# Patient Record
Sex: Female | Born: 1950 | Race: White | Hispanic: No | Marital: Married | Smoking: Never smoker
Health system: Southern US, Community
[De-identification: ages and names within clinical notes are randomized; demographics above are authoritative.]

## PROBLEM LIST (undated history)

## (undated) DIAGNOSIS — I10 Essential (primary) hypertension: Secondary | ICD-10-CM

## (undated) DIAGNOSIS — E559 Vitamin D deficiency, unspecified: Secondary | ICD-10-CM

## (undated) DIAGNOSIS — E785 Hyperlipidemia, unspecified: Secondary | ICD-10-CM

## (undated) HISTORY — PX: TONSILLECTOMY: SUR1361

## (undated) HISTORY — DX: Hyperlipidemia, unspecified: E78.5

## (undated) HISTORY — DX: Essential (primary) hypertension: I10

## (undated) HISTORY — DX: Vitamin D deficiency, unspecified: E55.9

---

## 1998-01-16 ENCOUNTER — Ambulatory Visit (HOSPITAL_COMMUNITY): Admission: RE | Admit: 1998-01-16 | Discharge: 1998-01-16 | Payer: Self-pay | Admitting: Obstetrics and Gynecology

## 1998-07-22 ENCOUNTER — Other Ambulatory Visit: Admission: RE | Admit: 1998-07-22 | Discharge: 1998-07-22 | Payer: Self-pay | Admitting: Obstetrics and Gynecology

## 2009-01-16 ENCOUNTER — Encounter: Admission: RE | Admit: 2009-01-16 | Discharge: 2009-01-16 | Payer: Self-pay | Admitting: Gastroenterology

## 2012-09-06 ENCOUNTER — Other Ambulatory Visit: Payer: Self-pay | Admitting: Family Medicine

## 2012-10-11 ENCOUNTER — Other Ambulatory Visit: Payer: Self-pay | Admitting: Family Medicine

## 2012-11-08 ENCOUNTER — Other Ambulatory Visit (INDEPENDENT_AMBULATORY_CARE_PROVIDER_SITE_OTHER): Payer: BC Managed Care – PPO

## 2012-11-08 ENCOUNTER — Other Ambulatory Visit: Payer: Self-pay | Admitting: Nurse Practitioner

## 2012-11-08 DIAGNOSIS — R5383 Other fatigue: Secondary | ICD-10-CM

## 2012-11-08 DIAGNOSIS — E785 Hyperlipidemia, unspecified: Secondary | ICD-10-CM

## 2012-11-08 DIAGNOSIS — Z79899 Other long term (current) drug therapy: Secondary | ICD-10-CM

## 2012-11-08 DIAGNOSIS — E559 Vitamin D deficiency, unspecified: Secondary | ICD-10-CM

## 2012-11-08 DIAGNOSIS — I10 Essential (primary) hypertension: Secondary | ICD-10-CM

## 2012-11-08 DIAGNOSIS — R5381 Other malaise: Secondary | ICD-10-CM

## 2012-11-08 LAB — BASIC METABOLIC PANEL WITH GFR
BUN: 12 mg/dL (ref 6–23)
CO2: 27 mEq/L (ref 19–32)
Chloride: 107 mEq/L (ref 96–112)
Glucose, Bld: 83 mg/dL (ref 70–99)
Potassium: 3.8 mEq/L (ref 3.5–5.3)

## 2012-11-08 LAB — HEPATIC FUNCTION PANEL
ALT: 15 U/L (ref 0–35)
Alkaline Phosphatase: 72 U/L (ref 39–117)
Indirect Bilirubin: 0.5 mg/dL (ref 0.0–0.9)
Total Protein: 6.2 g/dL (ref 6.0–8.3)

## 2012-11-08 LAB — POCT CBC
Granulocyte percent: 59.7 %G (ref 37–80)
HCT, POC: 32.9 % — AB (ref 37.7–47.9)
Hemoglobin: 11.4 g/dL — AB (ref 12.2–16.2)
MCV: 85 fL (ref 80–97)
RBC: 3.9 M/uL — AB (ref 4.04–5.48)
WBC: 6.2 10*3/uL (ref 4.6–10.2)

## 2012-11-09 LAB — VITAMIN D 25 HYDROXY (VIT D DEFICIENCY, FRACTURES): Vit D, 25-Hydroxy: 41 ng/mL (ref 30–89)

## 2012-11-10 ENCOUNTER — Other Ambulatory Visit: Payer: Self-pay | Admitting: Nurse Practitioner

## 2012-11-13 ENCOUNTER — Other Ambulatory Visit: Payer: Self-pay | Admitting: Nurse Practitioner

## 2012-11-13 LAB — NMR LIPOPROFILE WITH LIPIDS
Cholesterol, Total: 144 mg/dL (ref <200)
HDL Particle Number: 33 umol/L (ref >=30.5)
LDL Size: 20.6 nm (ref >20.5)
Large HDL-P: 4.7 umol/L — ABNORMAL LOW (ref >=4.8)
Small LDL Particle Number: 546 nmol/L — ABNORMAL HIGH (ref <=527)

## 2012-11-13 MED ORDER — VALACYCLOVIR HCL 1 G PO TABS: 1000.0000 mg | ORAL_TABLET | Freq: Two times a day (BID) | ORAL | Status: DC

## 2012-11-21 ENCOUNTER — Other Ambulatory Visit: Payer: Self-pay | Admitting: Family Medicine

## 2013-01-14 ENCOUNTER — Other Ambulatory Visit: Payer: Self-pay

## 2013-01-14 MED ORDER — QUINAPRIL HCL 20 MG PO TABS: 20.0000 mg | ORAL_TABLET | Freq: Every day | ORAL | Status: DC

## 2013-01-14 NOTE — Telephone Encounter (Signed)
Last seen 07/03/12  MMM  Patient requesting a 90 day supply

## 2013-03-28 ENCOUNTER — Other Ambulatory Visit (INDEPENDENT_AMBULATORY_CARE_PROVIDER_SITE_OTHER): Payer: BC Managed Care – PPO

## 2013-03-28 DIAGNOSIS — E785 Hyperlipidemia, unspecified: Secondary | ICD-10-CM

## 2013-03-28 DIAGNOSIS — R5381 Other malaise: Secondary | ICD-10-CM

## 2013-03-28 DIAGNOSIS — E559 Vitamin D deficiency, unspecified: Secondary | ICD-10-CM

## 2013-03-28 DIAGNOSIS — Z79899 Other long term (current) drug therapy: Secondary | ICD-10-CM

## 2013-03-28 LAB — POCT CBC
Hemoglobin: 13.7 g/dL (ref 12.2–16.2)
Lymph, poc: 2.2 (ref 0.6–3.4)
MCHC: 33.7 g/dL (ref 31.8–35.4)
MPV: 7.7 fL (ref 0–99.8)
POC Granulocyte: 4 (ref 2–6.9)
POC LYMPH PERCENT: 33.8 %L (ref 10–50)
Platelet Count, POC: 276 10*3/uL (ref 142–424)

## 2013-03-30 LAB — BMP8+EGFR
CO2: 28 mmol/L (ref 18–29)
Calcium: 9.8 mg/dL (ref 8.6–10.2)
Chloride: 103 mmol/L (ref 97–108)
GFR calc non Af Amer: 90 mL/min/{1.73_m2} (ref >59)
Glucose: 89 mg/dL (ref 65–99)
Potassium: 4.7 mmol/L (ref 3.5–5.2)
Sodium: 144 mmol/L (ref 134–144)

## 2013-03-30 LAB — NMR, LIPOPROFILE
Cholesterol: 149 mg/dL (ref <200)
HDL Cholesterol by NMR: 47 mg/dL (ref >=40)
LDL Particle Number: 1161 nmol/L — ABNORMAL HIGH (ref <1000)
LDLC SERPL CALC-MCNC: 83 mg/dL (ref <100)
Triglycerides by NMR: 95 mg/dL (ref <150)

## 2013-03-30 LAB — HEPATIC FUNCTION PANEL
ALT: 13 IU/L (ref 0–32)
Bilirubin, Direct: 0.13 mg/dL (ref 0.00–0.40)
Total Protein: 6.7 g/dL (ref 6.0–8.5)

## 2013-04-01 ENCOUNTER — Ambulatory Visit (INDEPENDENT_AMBULATORY_CARE_PROVIDER_SITE_OTHER): Payer: BC Managed Care – PPO | Admitting: Family Medicine

## 2013-04-01 ENCOUNTER — Encounter: Payer: Self-pay | Admitting: Family Medicine

## 2013-04-01 VITALS — BP 147/85 | HR 71 | Temp 97.6°F | Ht 64.0 in | Wt 136.0 lb

## 2013-04-01 DIAGNOSIS — I10 Essential (primary) hypertension: Secondary | ICD-10-CM

## 2013-04-01 DIAGNOSIS — E785 Hyperlipidemia, unspecified: Secondary | ICD-10-CM

## 2013-04-01 DIAGNOSIS — J309 Allergic rhinitis, unspecified: Secondary | ICD-10-CM

## 2013-04-01 DIAGNOSIS — E559 Vitamin D deficiency, unspecified: Secondary | ICD-10-CM

## 2013-04-01 NOTE — Patient Instructions (Addendum)
Continue current medications. Continue good therapeutic lifestyle changes.  Fall precautions discussed with patient. Follow up as planned and earlier as needed.  Try to get back and a better exercise routine Drink more water Eat less carbohydrates Increase vitamin D 3   To  5000 units and take 1 x  3 times weekly Nasacort is available over-the-counter use of one to 2 sprays at bedtime If any additional help is needed he may take a Claritin one daily or an Allegra as needed

## 2013-04-01 NOTE — Progress Notes (Signed)
Subjective:    Patient ID: Candice Beck, female    DOB: 21-Oct-1950, 62 y.o.   MRN: [id number]  HPI Pt here for follow up and management of chronic medical problems. Recent labs were reviewed with patient, and she understood the report.    There are no active problems to display for this patient.  Outpatient Encounter Prescriptions as of 04/01/2013  Medication Sig Dispense Refill  . atorvastatin (LIPITOR) 40 MG tablet TAKE ONE TABLET BY MOUTH ONE TIME DAILY  30 tablet  4  . cholecalciferol (VITAMIN D) 1000 UNITS tablet Take 2,000 Units by mouth daily.      . quinapril (ACCUPRIL) 20 MG tablet Take 1 tablet (20 mg total) by mouth daily.  90 tablet  0  . valACYclovir (VALTREX) 1000 MG tablet Take 1 tablet (1,000 mg total) by mouth 2 (two) times daily.  30 tablet  1   No facility-administered encounter medications on file as of 04/01/2013.    Review of Systems  Constitutional: Negative.   HENT: Negative.   Eyes: Negative.   Respiratory: Negative.   Cardiovascular: Negative.   Gastrointestinal: Negative.   Endocrine: Negative.   Genitourinary: Negative.   Musculoskeletal: Negative.   Skin: Negative.   Allergic/Immunologic: Negative.   Neurological: Negative.   Hematological: Negative.   Psychiatric/Behavioral: Negative.        Objective:   Physical Exam  Nursing note and vitals reviewed. Constitutional: She is oriented to person, place, and time. She appears well-developed and well-nourished.  HENT:  Head: Normocephalic and atraumatic.  Right Ear: External ear normal.  Left Ear: External ear normal.  Nose: Nose normal.  Mouth/Throat: Oropharynx is clear and moist.  Some nasal congestion bilaterally  Eyes: Conjunctivae and EOM are normal. Pupils are equal, round, and reactive to light. Right eye exhibits no discharge. Left eye exhibits no discharge. No scleral icterus.  Neck: Normal range of motion. Neck supple. No JVD present. No thyromegaly present.  Cardiovascular:  Normal rate, regular rhythm, normal heart sounds and intact distal pulses.  Exam reveals no gallop and no friction rub.   No murmur heard. At 72 per minute  Pulmonary/Chest: Effort normal and breath sounds normal. No respiratory distress. She has no wheezes. She has no rales. She exhibits no tenderness.  Abdominal: Soft. Bowel sounds are normal. She exhibits no mass. There is no tenderness. There is no rebound and no guarding.  Musculoskeletal: Normal range of motion. She exhibits no edema and no tenderness.  Lymphadenopathy:    She has no cervical adenopathy.  Neurological: She is alert and oriented to person, place, and time. She has normal reflexes. No cranial nerve deficit.  Skin: Skin is warm and dry.  Psychiatric: She has a normal mood and affect. Her behavior is normal. Judgment and thought content normal.   BP 147/85  Pulse 71  Temp(Src) 97.6 F (36.4 C) (Oral)  Ht 5\' 4"  (1.626 m)  Wt 136 lb (61.689 kg)  BMI 23.33 kg/m2        Assessment & Plan:   1. Hypertension   2. Hyperlipidemia   3. Vitamin D deficiency   4. Allergic rhinitis     Patient Instructions  Continue current medications. Continue good therapeutic lifestyle changes.  Fall precautions discussed with patient. Follow up as planned and earlier as needed.  Try to get back and a better exercise routine Drink more water Eat less carbohydrates Increase vitamin D 3   To  5000 units and take 1 x  3 times  weekly Nasacort is available over-the-counter use of one to 2 sprays at bedtime If any additional help is needed he may take a Claritin one daily or an Allegra as needed   Nyra Capes MD

## 2013-04-03 ENCOUNTER — Other Ambulatory Visit: Payer: Self-pay | Admitting: Nurse Practitioner

## 2013-04-03 MED ORDER — QUINAPRIL HCL 20 MG PO TABS: 20.0000 mg | ORAL_TABLET | Freq: Every day | ORAL | Status: DC

## 2013-04-25 ENCOUNTER — Other Ambulatory Visit: Payer: Self-pay | Admitting: Family Medicine

## 2013-04-25 ENCOUNTER — Other Ambulatory Visit: Payer: Self-pay

## 2013-07-30 ENCOUNTER — Other Ambulatory Visit: Payer: Self-pay | Admitting: Nurse Practitioner

## 2013-09-25 ENCOUNTER — Other Ambulatory Visit (INDEPENDENT_AMBULATORY_CARE_PROVIDER_SITE_OTHER): Payer: BC Managed Care – PPO

## 2013-09-25 DIAGNOSIS — E785 Hyperlipidemia, unspecified: Secondary | ICD-10-CM

## 2013-09-25 DIAGNOSIS — R5383 Other fatigue: Principal | ICD-10-CM

## 2013-09-25 DIAGNOSIS — E559 Vitamin D deficiency, unspecified: Secondary | ICD-10-CM

## 2013-09-25 DIAGNOSIS — R5381 Other malaise: Secondary | ICD-10-CM

## 2013-09-25 DIAGNOSIS — I10 Essential (primary) hypertension: Secondary | ICD-10-CM

## 2013-09-25 LAB — POCT CBC
Granulocyte percent: 62.2 %G (ref 37–80)
HEMATOCRIT: 39.6 % (ref 37.7–47.9)
HEMOGLOBIN: 13.1 g/dL (ref 12.2–16.2)
Lymph, poc: 2.2 (ref 0.6–3.4)
MCH: 28.7 pg (ref 27–31.2)
MCHC: 33.1 g/dL (ref 31.8–35.4)
MCV: 86.6 fL (ref 80–97)
MPV: 8.6 fL (ref 0–99.8)
POC Granulocyte: 3.8 (ref 2–6.9)
POC LYMPH PERCENT: 35.8 %L (ref 10–50)
Platelet Count, POC: 274 10*3/uL (ref 142–424)
RBC: 4.6 M/uL (ref 4.04–5.48)
RDW, POC: 13.2 %
WBC: 6.1 10*3/uL (ref 4.6–10.2)

## 2013-09-25 NOTE — Progress Notes (Signed)
Patient came in for labs only.

## 2013-09-26 ENCOUNTER — Encounter: Payer: Self-pay | Admitting: *Deleted

## 2013-09-26 NOTE — Progress Notes (Signed)
Quick Note:  Copy of labs sent to patient ______ 

## 2013-09-27 ENCOUNTER — Other Ambulatory Visit: Payer: BC Managed Care – PPO

## 2013-09-27 DIAGNOSIS — Z1212 Encounter for screening for malignant neoplasm of rectum: Secondary | ICD-10-CM

## 2013-09-27 LAB — BMP8+EGFR
BUN/Creatinine Ratio: 15 (ref 11–26)
BUN: 11 mg/dL (ref 8–27)
CO2: 26 mmol/L (ref 18–29)
Calcium: 9.6 mg/dL (ref 8.7–10.3)
Chloride: 105 mmol/L (ref 97–108)
Creatinine, Ser: 0.71 mg/dL (ref 0.57–1.00)
GFR, EST AFRICAN AMERICAN: 107 mL/min/{1.73_m2} (ref >59)
GFR, EST NON AFRICAN AMERICAN: 93 mL/min/{1.73_m2} (ref >59)
GLUCOSE: 93 mg/dL (ref 65–99)
POTASSIUM: 3.8 mmol/L (ref 3.5–5.2)
SODIUM: 144 mmol/L (ref 134–144)

## 2013-09-27 LAB — HEPATIC FUNCTION PANEL
ALK PHOS: 86 IU/L (ref 39–117)
ALT: 16 IU/L (ref 0–32)
AST: 19 IU/L (ref 0–40)
Albumin: 4.4 g/dL (ref 3.6–4.8)
BILIRUBIN DIRECT: 0.12 mg/dL (ref 0.00–0.40)
TOTAL PROTEIN: 6.6 g/dL (ref 6.0–8.5)
Total Bilirubin: 0.5 mg/dL (ref 0.0–1.2)

## 2013-09-27 LAB — NMR, LIPOPROFILE
CHOLESTEROL: 131 mg/dL (ref <200)
HDL Cholesterol by NMR: 46 mg/dL (ref >=40)
HDL Particle Number: 32.3 umol/L (ref >=30.5)
LDL PARTICLE NUMBER: 807 nmol/L (ref <1000)
LDL SIZE: 20.6 nm (ref >20.5)
LDLC SERPL CALC-MCNC: 68 mg/dL (ref <100)
LP-IR SCORE: 46 — AB (ref <=45)
SMALL LDL PARTICLE NUMBER: 361 nmol/L (ref <=527)
Triglycerides by NMR: 84 mg/dL (ref <150)

## 2013-09-27 LAB — VITAMIN D 25 HYDROXY (VIT D DEFICIENCY, FRACTURES): Vit D, 25-Hydroxy: 36.1 ng/mL (ref 30.0–100.0)

## 2013-09-27 NOTE — Progress Notes (Signed)
Pt dropped off fobt 

## 2013-09-28 LAB — FECAL OCCULT BLOOD, IMMUNOCHEMICAL: Fecal Occult Bld: NEGATIVE

## 2013-09-30 ENCOUNTER — Encounter: Payer: Self-pay | Admitting: Family Medicine

## 2013-09-30 ENCOUNTER — Ambulatory Visit (INDEPENDENT_AMBULATORY_CARE_PROVIDER_SITE_OTHER): Payer: BC Managed Care – PPO

## 2013-09-30 ENCOUNTER — Ambulatory Visit (INDEPENDENT_AMBULATORY_CARE_PROVIDER_SITE_OTHER): Payer: BC Managed Care – PPO | Admitting: Family Medicine

## 2013-09-30 VITALS — BP 130/72 | HR 65 | Temp 99.0°F | Ht 64.0 in | Wt 137.0 lb

## 2013-09-30 DIAGNOSIS — E559 Vitamin D deficiency, unspecified: Secondary | ICD-10-CM

## 2013-09-30 DIAGNOSIS — E785 Hyperlipidemia, unspecified: Secondary | ICD-10-CM

## 2013-09-30 DIAGNOSIS — I10 Essential (primary) hypertension: Secondary | ICD-10-CM

## 2013-09-30 DIAGNOSIS — Z13820 Encounter for screening for osteoporosis: Secondary | ICD-10-CM

## 2013-09-30 MED ORDER — EPINEPHRINE 0.3 MG/0.3ML IJ SOAJ: 0.3000 mg | Freq: Once | INTRAMUSCULAR | Status: DC

## 2013-09-30 NOTE — Patient Instructions (Addendum)
Continue current medications. Continue good therapeutic lifestyle changes which include good diet and exercise. Fall precautions discussed with patient. If an FOBT was given today- please return it to our front desk. If you are over 61 years old - you may need Prevnar 13 or the adult Pneumonia vaccine.  check with her insurance regarding the Prevnar vaccine Will call you with the results of the chest x-ray once those results are available  don't forget to schedule your DEXA scan in September Also a future date to remember is try to get a stress test again in May of 2018

## 2013-09-30 NOTE — Progress Notes (Signed)
Subjective:    Patient ID: Candice Beck, female    DOB: 24-Apr-1951, 63 y.o.   MRN: [id number]  HPI Pt here for follow up and management of chronic medical problems. Recent labs were the and they will be reviewed with her during the visit today. Her cholesterol numbers were excellent with taking the atorvastatin. And she has no problems with taking this. She also at was asked to increase her vitamin D to 2000 daily and she is taking 5000   3 times weekly now .         Patient Active Problem List   Diagnosis Date Noted  . Hypertension 04/01/2013  . Hyperlipidemia 04/01/2013  . Vitamin D deficiency 04/01/2013   Outpatient Encounter Prescriptions as of 09/30/2013  Medication Sig  . atorvastatin (LIPITOR) 40 MG tablet TAKE ONE TABLET BY MOUTH ONE TIME DAILY  . cholecalciferol (VITAMIN D) 1000 UNITS tablet Take 5,000 Units by mouth daily. 3 times weekly  . Coenzyme Q10 (CO Q 10 PO) Take 1 tablet by mouth daily.  . quinapril (ACCUPRIL) 20 MG tablet TAKE 1 TABLET (20 MG TOTAL) BY MOUTH DAILY.  . valACYclovir (VALTREX) 1000 MG tablet Take 1 tablet (1,000 mg total) by mouth 2 (two) times daily.    Review of Systems  Constitutional: Negative.   HENT: Negative.   Eyes: Negative.   Respiratory: Negative.   Cardiovascular: Negative.   Gastrointestinal: Negative.   Endocrine: Negative.   Genitourinary: Negative.   Musculoskeletal: Negative.   Skin: Negative.   Allergic/Immunologic: Negative.   Neurological: Negative.   Hematological: Negative.   Psychiatric/Behavioral: Negative.        Objective:   Physical Exam  Nursing note and vitals reviewed. Constitutional: She is oriented to person, place, and time. She appears well-developed and well-nourished. No distress.  HENT:  Head: Normocephalic and atraumatic.  Right Ear: External ear normal.  Left Ear: External ear normal.  Nose: Nose normal.  Mouth/Throat: Oropharynx is clear and moist. No oropharyngeal exudate.  Eyes:  Conjunctivae and EOM are normal. Pupils are equal, round, and reactive to light. Right eye exhibits no discharge. Left eye exhibits no discharge. No scleral icterus.  Neck: Normal range of motion. Neck supple. No thyromegaly present.  No carotid bruits  Cardiovascular: Normal rate, regular rhythm and normal heart sounds.  Exam reveals no gallop and no friction rub.   No murmur heard. At 72 per minute  Pulmonary/Chest: Effort normal and breath sounds normal. No respiratory distress. She has no wheezes. She has no rales.   No axillary adenopathy  Abdominal: Soft. Bowel sounds are normal. She exhibits no mass. There is no tenderness. There is no rebound and no guarding.  Musculoskeletal: Normal range of motion. She exhibits no edema and no tenderness.  Lymphadenopathy:    She has no cervical adenopathy.  Neurological: She is alert and oriented to person, place, and time.  Skin: Skin is warm and dry. No rash noted.  Psychiatric: She has a normal mood and affect. Her behavior is normal. Judgment and thought content normal.   BP 130/72  Pulse 65  Temp(Src) 99 F (37.2 C) (Oral)  Ht 5\' 4"  (1.626 m)  Wt 137 lb (62.143 kg)  BMI 23.50 kg/m2  [facility] reading (PRIMARY) by  Dr. Tracie HarrierMoore-chest x-ray- no active disease  Assessment & Plan:  1. Hyperlipidemia  2. Hypertension - DG Chest 2 View; Future  3. Vitamin D deficiency  4. Osteoporosis screening - DG Bone Density; Future  Meds ordered this encounter  Medications  . Coenzyme Q10 (CO Q 10 PO)    Sig: Take 1 tablet by mouth daily.  Marland Kitchen. EPINEPHrine (EPI-PEN) 0.3 mg/0.3 mL SOAJ injection    Sig: Inject 0.3 mLs (0.3 mg total) into the muscle once.    Dispense:  1 Device    Refill:  3   Patient Instructions  Continue current medications. Continue good therapeutic lifestyle changes which include good diet and exercise. Fall precautions discussed with patient. If an FOBT was given today- please return  it to our front desk. If you are over 508 years old - you may need Prevnar 13 or the adult Pneumonia vaccine.  check with her insurance regarding the Prevnar vaccine Will call you with the results of the chest x-ray once those results are available  don't forget to schedule your DEXA scan in September Also a future date to remember is try to get a stress test again in May of 2018     Nyra Capeson W. Kadon Andrus MD

## 2013-10-10 ENCOUNTER — Encounter: Payer: Self-pay | Admitting: *Deleted

## 2013-10-30 ENCOUNTER — Other Ambulatory Visit: Payer: Self-pay | Admitting: Family Medicine

## 2014-02-05 ENCOUNTER — Ambulatory Visit: Payer: BC Managed Care – PPO

## 2014-02-05 ENCOUNTER — Ambulatory Visit (INDEPENDENT_AMBULATORY_CARE_PROVIDER_SITE_OTHER): Payer: BC Managed Care – PPO

## 2014-02-05 DIAGNOSIS — Z13820 Encounter for screening for osteoporosis: Secondary | ICD-10-CM

## 2014-02-05 LAB — HM DEXA SCAN

## 2014-02-07 ENCOUNTER — Other Ambulatory Visit: Payer: Self-pay | Admitting: Family Medicine

## 2014-03-07 ENCOUNTER — Ambulatory Visit (INDEPENDENT_AMBULATORY_CARE_PROVIDER_SITE_OTHER): Payer: BC Managed Care – PPO | Admitting: Pharmacist

## 2014-03-07 ENCOUNTER — Encounter: Payer: Self-pay | Admitting: Pharmacist

## 2014-03-07 ENCOUNTER — Telehealth: Payer: Self-pay | Admitting: Family Medicine

## 2014-03-07 DIAGNOSIS — M949 Disorder of cartilage, unspecified: Secondary | ICD-10-CM

## 2014-03-07 DIAGNOSIS — M8580 Other specified disorders of bone density and structure, unspecified site: Secondary | ICD-10-CM

## 2014-03-07 DIAGNOSIS — M899 Disorder of bone, unspecified: Secondary | ICD-10-CM

## 2014-03-07 MED ORDER — EPINEPHRINE 0.3 MG/0.3ML IJ SOAJ: 0.3000 mg | INTRAMUSCULAR | Status: DC | PRN

## 2014-03-07 NOTE — Telephone Encounter (Signed)
Call given to TBE °

## 2014-03-07 NOTE — Patient Instructions (Signed)

## 2014-03-07 NOTE — Progress Notes (Signed)
Osteoporosis Clinic Current Height:        Max Lifetime Height:   Current Weight:         Ethnicity:Caucasian   HPI: Does pt already have a diagnosis of:  Osteopenia?  Yes Osteoporosis?  No  Back Pain?  Yes - lower right shoulder blade with activity.  No history of back surgery.      Kyphosis?  No Prior fracture?  Yes - second toe on left foot Med(s) for Osteoporosis/Osteopenia:  none Med(s) previously tried for Osteoporosis/Osteopenia:  none                                                             PMH: Age at menopause:  63 yo Hysterectomy?  No Oophorectomy?  No HRT? No Steroid Use?  No Thyroid med?  No History of cancer?  No History of digestive disorders (ie Crohn's)?  No Current or previous eating disorders?  No Last Vitamin D Result:  36.1 (09/2013) Last GFR Result:  93 (09/2013)   FH/SH: Family history of osteoporosis?  Yes - mother Parent with history of hip fracture?  No Family history of breast cancer?  Yes - mother and maternal aunt Exercise?  Yes   Smoking?  No Alcohol?  Yes - occ wine    Calcium Assessment Calcium Intake  # of servings/day  Calcium mg  Milk (8 oz) 0  x  300  = 0  Yogurt (4 oz) 1 x  200 =   Cheese (1 oz) 1 x  200 =   Other Calcium sources     Ca supplement 0 = 0   Estimated calcium intake per day     DEXA Results Date of Test T-Score for AP Spine L1-L4 T-Score for Total Left Hip T-Score for Total Right Hip T-Score for Neck of Left Hip T-Score for Neck of Right Hip  02/05/2014 -1.7 -1.1 -0.9 -1.4 -1.4  02/08/2012 -1.1 -1.0 -0.7 -1.3 -1.1  11/06/2006 -1.4 -0.7 -0.6 -1.0 -0.8          FRAX 10 year estimate: Total FX risk:  8.6%  (consider medication if >/= 20%) Hip FX risk:  0.9%  (consider medication if >/= 3%)  Assessment: Osteopenia - low estimated 10 year fracture risk but with family history of osteoporosis  Recommendations: 1.  No pharmacotherapy recommended currently 2.  recommend calcium   daily through supplementation or diet.  3.  recommend weight bearing exercise - 30 minutes at least 4 days per week.   4.  Counseled and educated about fall risk and prevention.  Recheck DEXA:  2 years  Time spent counseling patient:  30 minutes

## 2014-04-04 ENCOUNTER — Other Ambulatory Visit (INDEPENDENT_AMBULATORY_CARE_PROVIDER_SITE_OTHER): Payer: BC Managed Care – PPO

## 2014-04-04 ENCOUNTER — Other Ambulatory Visit: Payer: Self-pay | Admitting: Nurse Practitioner

## 2014-04-04 DIAGNOSIS — E559 Vitamin D deficiency, unspecified: Secondary | ICD-10-CM

## 2014-04-04 DIAGNOSIS — E785 Hyperlipidemia, unspecified: Secondary | ICD-10-CM

## 2014-04-04 DIAGNOSIS — I10 Essential (primary) hypertension: Secondary | ICD-10-CM

## 2014-04-04 LAB — POCT CBC
Granulocyte percent: 62.2 %G (ref 37–80)
HCT, POC: 39.6 % (ref 37.7–47.9)
Hemoglobin: 13.1 g/dL (ref 12.2–16.2)
LYMPH, POC: 2.4 (ref 0.6–3.4)
MCH: 28.7 pg (ref 27–31.2)
MCHC: 33.1 g/dL (ref 31.8–35.4)
MCV: 86.6 fL (ref 80–97)
MPV: 8.5 fL (ref 0–99.8)
PLATELET COUNT, POC: 270 10*3/uL (ref 142–424)
POC Granulocyte: 4.4 (ref 2–6.9)
POC LYMPH %: 33.7 % (ref 10–50)
RBC: 4.6 M/uL (ref 4.04–5.48)
RDW, POC: 13.3 %
WBC: 7 10*3/uL (ref 4.6–10.2)

## 2014-04-04 NOTE — Progress Notes (Signed)
Lab only 

## 2014-04-05 LAB — NMR, LIPOPROFILE
Cholesterol: 153 mg/dL (ref 100–199)
HDL CHOLESTEROL BY NMR: 48 mg/dL (ref >39)
HDL Particle Number: 33.5 umol/L (ref >=30.5)
LDL PARTICLE NUMBER: 1082 nmol/L — AB (ref <1000)
LDL Size: 20.6 nm (ref >20.5)
LDLC SERPL CALC-MCNC: 92 mg/dL (ref 0–99)
LP-IR Score: 46 — ABNORMAL HIGH (ref <=45)
SMALL LDL PARTICLE NUMBER: 477 nmol/L (ref <=527)
Triglycerides by NMR: 65 mg/dL (ref 0–149)

## 2014-04-05 LAB — BMP8+EGFR
BUN/Creatinine Ratio: 21 (ref 11–26)
BUN: 14 mg/dL (ref 8–27)
CALCIUM: 9.4 mg/dL (ref 8.7–10.3)
CHLORIDE: 102 mmol/L (ref 97–108)
CO2: 24 mmol/L (ref 18–29)
CREATININE: 0.68 mg/dL (ref 0.57–1.00)
GFR calc Af Amer: 109 mL/min/{1.73_m2} (ref >59)
GFR calc non Af Amer: 95 mL/min/{1.73_m2} (ref >59)
Glucose: 72 mg/dL (ref 65–99)
Potassium: 3.6 mmol/L (ref 3.5–5.2)
Sodium: 143 mmol/L (ref 134–144)

## 2014-04-05 LAB — VITAMIN D 25 HYDROXY (VIT D DEFICIENCY, FRACTURES): VIT D 25 HYDROXY: 41 ng/mL (ref 30.0–100.0)

## 2014-04-05 LAB — HEPATIC FUNCTION PANEL
ALT: 11 IU/L (ref 0–32)
AST: 16 IU/L (ref 0–40)
Albumin: 4.5 g/dL (ref 3.6–4.8)
Alkaline Phosphatase: 87 IU/L (ref 39–117)
BILIRUBIN TOTAL: 0.5 mg/dL (ref 0.0–1.2)
Bilirubin, Direct: 0.13 mg/dL (ref 0.00–0.40)
Total Protein: 6.6 g/dL (ref 6.0–8.5)

## 2014-04-07 ENCOUNTER — Ambulatory Visit: Payer: BC Managed Care – PPO | Admitting: Family Medicine

## 2014-04-07 NOTE — Telephone Encounter (Signed)
Last seen in office on 03-07-14 by pharmacist. Last OV was 09-30-13. Please advise.

## 2014-04-08 ENCOUNTER — Telehealth: Payer: Self-pay | Admitting: Family Medicine

## 2014-04-08 NOTE — Telephone Encounter (Signed)
Message copied by Azalee CourseFULP, Brayden Betters on Tue Apr 08, 2014 10:35 AM ------      Message from: Ernestina PennaMOORE, DONALD W      Created: Sat Apr 05, 2014  7:37 AM       The blood sugar is good at 72. The creatinine, the most important kidney function test is within normal limits. The electrolytes including potassium are within normal limits      All liver function tests are within normal limits      Cholesterol numbers with advanced lipid testing have increased slightly. The total LDL particle numbers now 1082. Previously it was 807. The LDL C. was 92 and previously it was 68. The triglycerides remain good at 65. The patient should continue with her atorvastatin but should make a special effort to try to do better with her diet and exercise as this has worked in the past so we know it can work again if she focuses her attention on these 2 things.      The vitamin D level is good, continue current treatment ------

## 2014-04-16 ENCOUNTER — Ambulatory Visit: Payer: BC Managed Care – PPO | Admitting: Family Medicine

## 2014-04-24 ENCOUNTER — Encounter: Payer: Self-pay | Admitting: Family Medicine

## 2014-04-24 ENCOUNTER — Ambulatory Visit (INDEPENDENT_AMBULATORY_CARE_PROVIDER_SITE_OTHER): Payer: BC Managed Care – PPO | Admitting: Family Medicine

## 2014-04-24 VITALS — BP 130/83 | HR 73 | Temp 96.9°F | Ht 64.0 in | Wt 135.0 lb

## 2014-04-24 DIAGNOSIS — E559 Vitamin D deficiency, unspecified: Secondary | ICD-10-CM

## 2014-04-24 DIAGNOSIS — I10 Essential (primary) hypertension: Secondary | ICD-10-CM

## 2014-04-24 DIAGNOSIS — E785 Hyperlipidemia, unspecified: Secondary | ICD-10-CM

## 2014-04-24 NOTE — Patient Instructions (Addendum)
Continue current medications. Continue good therapeutic lifestyle changes which include good diet and exercise. Fall precautions discussed with patient. If an FOBT was given today- please return it to our front desk. If you are over 61 years old - you may need Prevnar 13 or the adult Pneumonia vaccine.  Flu Shots will be available at our office starting mid- September. Please call and schedule a FLU CLINIC APPOINTMENT.   Resume regular cholesterol medication Increase vitamin D to 4 days weekly Resume exercise regimen

## 2014-04-24 NOTE — Progress Notes (Signed)
Subjective:    Patient ID: Candice Beck, female    DOB: 1951/06/06, 63 y.o.   MRN: [id number]  HPI Pt here for follow up and management of chronic medical problems. The patient has had recent lab work and this will be reviewed with her at the visit today the only thing that was out of line was the LDL particle number was elevated compared to previously. This may be somewhat excuse because the patient been on extended trip on the road and did not eat as well as maybe she could have. The patient also notes that she has reduced her cholesterol medicine just to see what would happen with her cholesterol        Patient Active Problem List   Diagnosis Date Noted  . Osteopenia 03/07/2014  . Hypertension 04/01/2013  . Hyperlipidemia 04/01/2013  . Vitamin D deficiency 04/01/2013   Outpatient Encounter Prescriptions as of 04/24/2014  Medication Sig  . atorvastatin (LIPITOR) 40 MG tablet TAKE ONE TABLET BY MOUTH ONE TIME DAILY  . cholecalciferol (VITAMIN D) 1000 UNITS tablet Take 5,000 Units by mouth daily. 3 times weekly  . Coenzyme Q10 (CO Q 10 PO) Take 1 tablet by mouth daily.  Marland Kitchen. EPINEPHrine 0.3 mg/0.3 mL IJ SOAJ injection Inject 0.3 mLs (0.3 mg total) into the muscle as needed (for anaphylactic / allergic reaction).  . quinapril (ACCUPRIL) 20 MG tablet TAKE ONE TABLET BY MOUTH ONE TIME DAILY  . valACYclovir (VALTREX) 1000 MG tablet TAKE ONE TABLET BY MOUTH DAILY AS NEEDED    Review of Systems  Constitutional: Negative.   HENT: Negative.   Eyes: Negative.   Respiratory: Negative.   Cardiovascular: Negative.   Gastrointestinal: Negative.   Endocrine: Negative.   Genitourinary: Negative.   Musculoskeletal: Negative.   Skin: Negative.   Allergic/Immunologic: Negative.   Neurological: Negative.   Hematological: Negative.   Psychiatric/Behavioral: Negative.        Objective:   Physical Exam  Constitutional: She is oriented to person, place, and time. She appears  well-developed and well-nourished. No distress.  Alert and cooperative  HENT:  Head: Normocephalic and atraumatic.  Right Ear: External ear normal.  Left Ear: External ear normal.  Mouth/Throat: Oropharynx is clear and moist.  Minimal nasal congestion bilaterally  Eyes: Conjunctivae and EOM are normal. Pupils are equal, round, and reactive to light. Right eye exhibits no discharge. Left eye exhibits no discharge. No scleral icterus.  Neck: Normal range of motion. Neck supple. No thyromegaly present.  Cardiovascular: Normal rate, regular rhythm, normal heart sounds and intact distal pulses.  Exam reveals no gallop and no friction rub.   No murmur heard. Pulmonary/Chest: Effort normal and breath sounds normal. No respiratory distress. She has no wheezes. She has no rales. She exhibits no tenderness.  Abdominal: Soft. Bowel sounds are normal. She exhibits no mass. There is no tenderness. There is no rebound and no guarding.  Musculoskeletal: Normal range of motion. She exhibits no edema or tenderness.  Lymphadenopathy:    She has no cervical adenopathy.  Neurological: She is alert and oriented to person, place, and time. She has normal reflexes. No cranial nerve deficit.  Skin: Skin is warm and dry. No rash noted.  Psychiatric: She has a normal mood and affect. Her behavior is normal. Judgment and thought content normal.  Nursing note and vitals reviewed.  BP 130/83 mmHg  Pulse 73  Temp(Src) 96.9 F (36.1 C) (Oral)  Ht 5\' 4"  (1.626 m)  Wt 135 lb (61.236  kg)  BMI 23.16 kg/m2        Assessment & Plan:  1. Hyperlipidemia  2. Essential hypertension  3. Vitamin D deficiency  Patient Instructions  Continue current medications. Continue good therapeutic lifestyle changes which include good diet and exercise. Fall precautions discussed with patient. If an FOBT was given today- please return it to our front desk. If you are over 61 years old - you may need Prevnar 13 or the adult  Pneumonia vaccine.  Flu Shots will be available at our office starting mid- September. Please call and schedule a FLU CLINIC APPOINTMENT.   Resume regular cholesterol medication Increase vitamin D to 4 days weekly Resume exercise regimen   Nyra Capeson W. Moore MD

## 2014-06-04 ENCOUNTER — Telehealth: Payer: Self-pay | Admitting: Family Medicine

## 2014-06-04 ENCOUNTER — Other Ambulatory Visit: Payer: Self-pay | Admitting: *Deleted

## 2014-06-04 MED ORDER — SCOPOLAMINE 1 MG/3DAYS TD PT72: 1.0000 | MEDICATED_PATCH | TRANSDERMAL | Status: DC

## 2014-06-04 NOTE — Telephone Encounter (Signed)
Going on a cruise - per dwm ok

## 2014-06-25 ENCOUNTER — Other Ambulatory Visit: Payer: Self-pay | Admitting: Family Medicine

## 2014-07-16 ENCOUNTER — Ambulatory Visit: Payer: BC Managed Care – PPO | Admitting: Nurse Practitioner

## 2014-07-31 ENCOUNTER — Ambulatory Visit (INDEPENDENT_AMBULATORY_CARE_PROVIDER_SITE_OTHER): Payer: BC Managed Care – PPO | Admitting: Nurse Practitioner

## 2014-07-31 ENCOUNTER — Encounter: Payer: Self-pay | Admitting: Nurse Practitioner

## 2014-07-31 VITALS — BP 139/81 | HR 79 | Temp 97.0°F | Ht 64.0 in | Wt 142.0 lb

## 2014-07-31 DIAGNOSIS — Z0000 Encounter for general adult medical examination without abnormal findings: Secondary | ICD-10-CM

## 2014-07-31 DIAGNOSIS — Z01419 Encounter for gynecological examination (general) (routine) without abnormal findings: Secondary | ICD-10-CM

## 2014-07-31 LAB — POCT URINALYSIS DIPSTICK
BILIRUBIN UA: NEGATIVE
GLUCOSE UA: NEGATIVE
Ketones, UA: NEGATIVE
NITRITE UA: NEGATIVE
PH UA: 5
Protein, UA: NEGATIVE
SPEC GRAV UA: 1.015
UROBILINOGEN UA: NEGATIVE

## 2014-07-31 LAB — POCT UA - MICROSCOPIC ONLY
Bacteria, U Microscopic: NEGATIVE
Casts, Ur, LPF, POC: NEGATIVE
Crystals, Ur, HPF, POC: NEGATIVE
Mucus, UA: NEGATIVE
Yeast, UA: NEGATIVE

## 2014-07-31 NOTE — Progress Notes (Signed)
   Subjective:    Patient ID: Candice Beck, female    DOB: 03/19/1951, 64 y.o.   MRN: [id number]  HPI This is a regular patient of Dr. Christell ConstantMoore that was seen for follow up in November 2015- she is here today for Pap only. She has no complaints today and says she is doing well.    Review of Systems  Constitutional: Negative.   HENT: Negative.   Respiratory: Negative.   Cardiovascular: Negative.   Gastrointestinal: Negative.   Genitourinary: Negative.   Neurological: Negative.   Psychiatric/Behavioral: Negative.   All other systems reviewed and are negative.      Objective:   Physical Exam  Constitutional: She is oriented to person, place, and time. She appears well-developed and well-nourished.  HENT:  Head: Normocephalic.  Right Ear: Hearing, tympanic membrane, external ear and ear canal normal.  Left Ear: Hearing, tympanic membrane, external ear and ear canal normal.  Nose: Nose normal.  Mouth/Throat: Uvula is midline and oropharynx is clear and moist.  Eyes: Conjunctivae and EOM are normal. Pupils are equal, round, and reactive to light.  Neck: Normal range of motion and full passive range of motion without pain. Neck supple. No JVD present. Carotid bruit is not present. No thyroid mass and no thyromegaly present.  Cardiovascular: Normal rate, normal heart sounds and intact distal pulses.   No murmur heard. Pulmonary/Chest: Effort normal and breath sounds normal. Right breast exhibits no inverted nipple, no mass, no nipple discharge, no skin change and no tenderness. Left breast exhibits no inverted nipple, no mass, no nipple discharge, no skin change and no tenderness.  Abdominal: Soft. Bowel sounds are normal. She exhibits no mass. There is no tenderness.  Genitourinary: Vagina normal and uterus normal. No breast swelling, tenderness, discharge or bleeding.  bimanual exam-No adnexal masses or tenderness. Cervical stenosis No vaginal discharge  Musculoskeletal: Normal range  of motion.  Lymphadenopathy:    She has no cervical adenopathy.  Neurological: She is alert and oriented to person, place, and time.  Skin: Skin is warm and dry.  Psychiatric: She has a normal mood and affect. Her behavior is normal. Judgment and thought content normal.   BP 139/81 mmHg  Pulse 79  Temp(Src) 97 F (36.1 C) (Oral)  Ht 5\' 4"  (1.626 m)  Wt 142 lb (64.411 kg)  BMI 24.36 kg/m2        Assessment & Plan:  1. Annual physical exam - POCT urinalysis dipstick - POCT UA - Microscopic Only  2. Encounter for routine gynecological examination - Pap IG w/ reflex to HPV when ASC-U  Health maintenance reviewed Diet and exercise encouraged PAP pending Keep follow up appointment with Dr. Cristela FeltMoore  Mary-Margaret Azarel Banner, FNP

## 2014-07-31 NOTE — Patient Instructions (Signed)
Pap Test A Pap test is a procedure done in a clinic office to evaluate cells that are on the surface of the cervix. The cervix is the lower portion of the uterus and upper portion of the vagina. For some women, the cervical region has the potential to form cancer. With consistent evaluations by your caregiver, this type of cancer can be prevented.  If a Pap test is abnormal, it is most often a result of a previous exposure to human papillomavirus (HPV). HPV is a virus that can infect the cells of the cervix and cause dysplasia. Dysplasia is where the cells no longer look normal. If a woman has been diagnosed with high-grade or severe dysplasia, they are at higher risk of developing cervical cancer. People diagnosed with low-grade dysplasia should still be seen by their caregiver because there is a small chance that low-grade dysplasia could develop into cancer.  LET YOUR CAREGIVER KNOW ABOUT:  Recent sexually transmitted infection (STI) you have had.  Any new sex partners you have had.  History of previous abnormal Pap tests results.  History of previous cervical procedures you have had (colposcopy, biopsy, loop electrosurgical excision procedure [LEEP]).  Concerns you have had regarding unusual vaginal discharge.  History of pelvic pain.  Your use of birth control. BEFORE THE PROCEDURE  Ask your caregiver when to schedule your Pap test. It is best not to be on your period if your caregiver uses a wooden spatula to collect cells or applies cells to a glass slide. Newer techniques are not so sensitive to the timing of a menstrual cycle.  Do not douche or have sexual intercourse for 24 hours before the test.   Do not use vaginal creams or tampons for 24 hours before the test.   Empty your bladder just before the test to lessen any discomfort.  PROCEDURE You will lie on an exam table with your feet in stirrups. A warm metal or plastic instrument (speculum) is placed in your vagina. This  instrument allows your caregiver to see the inside of your vagina and look at your cervix. A small, plastic brush or wooden spatula is then used to collect cervical cells. These cells are placed in a lab specimen container. The cells are looked at under a microscope. A specialist will determine if the cells are normal.  AFTER THE PROCEDURE Make sure to get your test results.If your results come back abnormal, you may need further testing.  Document Released: 08/27/2002 Document Revised: 08/29/2011 Document Reviewed: 06/02/2011 Exit[employer] Patient Information 2015 [employer]. This information is not intended to replace advice given to you by your health care provider. Make sure you discuss any questions you have with your health care provider.  

## 2014-07-31 NOTE — Addendum Note (Signed)
Addended by: Prescott GumLAND, Leeba Barbe M on: 07/31/2014 02:22 PM   Modules accepted: Kipp BroodSmartSet

## 2014-07-31 NOTE — Addendum Note (Signed)
Addended by: Prescott GumLAND, Brett Soza M on: 07/31/2014 05:09 PM   Modules accepted: Orders, SmartSet

## 2014-08-02 LAB — PAP IG W/ RFLX HPV ASCU: PAP Smear Comment: 0

## 2014-08-02 LAB — URINE CULTURE

## 2014-10-24 ENCOUNTER — Other Ambulatory Visit: Payer: Self-pay | Admitting: Family Medicine

## 2014-10-27 ENCOUNTER — Other Ambulatory Visit (INDEPENDENT_AMBULATORY_CARE_PROVIDER_SITE_OTHER): Payer: BC Managed Care – PPO

## 2014-10-27 DIAGNOSIS — E785 Hyperlipidemia, unspecified: Secondary | ICD-10-CM | POA: Diagnosis not present

## 2014-10-27 DIAGNOSIS — E559 Vitamin D deficiency, unspecified: Secondary | ICD-10-CM

## 2014-10-27 DIAGNOSIS — I10 Essential (primary) hypertension: Secondary | ICD-10-CM

## 2014-10-27 LAB — POCT CBC
GRANULOCYTE PERCENT: 66.1 % (ref 37–80)
HEMATOCRIT: 38.5 % (ref 37.7–47.9)
HEMOGLOBIN: 12.1 g/dL — AB (ref 12.2–16.2)
Lymph, poc: 2.1 (ref 0.6–3.4)
MCH, POC: 26.9 pg — AB (ref 27–31.2)
MCHC: 31.4 g/dL — AB (ref 31.8–35.4)
MCV: 85.8 fL (ref 80–97)
MPV: 8.7 fL (ref 0–99.8)
POC GRANULOCYTE: 4.9 (ref 2–6.9)
POC LYMPH %: 28.8 % (ref 10–50)
Platelet Count, POC: 322 10*3/uL (ref 142–424)
RBC: 4.49 M/uL (ref 4.04–5.48)
RDW, POC: 14.4 %
WBC: 7.4 10*3/uL (ref 4.6–10.2)

## 2014-10-27 NOTE — Progress Notes (Signed)
Lab only 

## 2014-10-28 LAB — NMR, LIPOPROFILE
Cholesterol: 131 mg/dL (ref 100–199)
HDL CHOLESTEROL BY NMR: 57 mg/dL (ref >39)
HDL Particle Number: 32.4 umol/L (ref >=30.5)
LDL Particle Number: 836 nmol/L (ref <1000)
LDL Size: 20.6 nm (ref >20.5)
LDL-C: 61 mg/dL (ref 0–99)
LP-IR SCORE: 33 (ref <=45)
SMALL LDL PARTICLE NUMBER: 369 nmol/L (ref <=527)
Triglycerides by NMR: 64 mg/dL (ref 0–149)

## 2014-10-28 LAB — BMP8+EGFR
BUN/Creatinine Ratio: 16 (ref 11–26)
BUN: 12 mg/dL (ref 8–27)
CALCIUM: 9.5 mg/dL (ref 8.7–10.3)
CO2: 25 mmol/L (ref 18–29)
Chloride: 104 mmol/L (ref 97–108)
Creatinine, Ser: 0.74 mg/dL (ref 0.57–1.00)
GFR calc Af Amer: 101 mL/min/{1.73_m2} (ref >59)
GFR calc non Af Amer: 88 mL/min/{1.73_m2} (ref >59)
Glucose: 96 mg/dL (ref 65–99)
POTASSIUM: 3.8 mmol/L (ref 3.5–5.2)
Sodium: 145 mmol/L — ABNORMAL HIGH (ref 134–144)

## 2014-10-28 LAB — HEPATIC FUNCTION PANEL
ALBUMIN: 4.4 g/dL (ref 3.6–4.8)
ALK PHOS: 99 IU/L (ref 39–117)
ALT: 15 IU/L (ref 0–32)
AST: 16 IU/L (ref 0–40)
BILIRUBIN, DIRECT: 0.14 mg/dL (ref 0.00–0.40)
Bilirubin Total: 0.4 mg/dL (ref 0.0–1.2)
Total Protein: 6.4 g/dL (ref 6.0–8.5)

## 2014-10-28 LAB — VITAMIN D 25 HYDROXY (VIT D DEFICIENCY, FRACTURES): Vit D, 25-Hydroxy: 50 ng/mL (ref 30.0–100.0)

## 2014-10-29 ENCOUNTER — Ambulatory Visit (INDEPENDENT_AMBULATORY_CARE_PROVIDER_SITE_OTHER): Payer: BC Managed Care – PPO | Admitting: Family Medicine

## 2014-10-29 ENCOUNTER — Encounter: Payer: Self-pay | Admitting: Family Medicine

## 2014-10-29 VITALS — BP 132/81 | HR 76 | Temp 97.9°F | Ht 64.0 in | Wt 140.0 lb

## 2014-10-29 DIAGNOSIS — E785 Hyperlipidemia, unspecified: Secondary | ICD-10-CM

## 2014-10-29 DIAGNOSIS — R12 Heartburn: Secondary | ICD-10-CM | POA: Diagnosis not present

## 2014-10-29 DIAGNOSIS — E559 Vitamin D deficiency, unspecified: Secondary | ICD-10-CM | POA: Diagnosis not present

## 2014-10-29 DIAGNOSIS — I10 Essential (primary) hypertension: Secondary | ICD-10-CM | POA: Diagnosis not present

## 2014-10-29 NOTE — Progress Notes (Signed)
Subjective:    Patient ID: Candice Beck, female    DOB: 22-Sep-1950, 64 y.o.   MRN: [id number]  HPI Pt here for follow up and management of chronic medical problems which includes hypertension and hyperlipidemia. She is taking medications regularly. The current lab work has already been reviewed by the patient at home and we reviewed it again with her today. She denies chest pain or shortness of breath. She does have problems occasionally with severe heartburn especially with eating certain fruits and the skin on the fruits. She had a barium swallow in the past and this showed some esophageal spasm. She will continue to think about whether she needs to go back and see the gastroenterologist for further evaluation and possible endoscopy. Otherwise her bowels are normal and there is no blood in the stool when she is up-to-date on her colonoscopies. She has no problems with voiding. All of her lab work was excellent and no changes will be made in her medication.       Patient Active Problem List   Diagnosis Date Noted  . Osteopenia 03/07/2014  . Hypertension 04/01/2013  . Hyperlipidemia 04/01/2013  . Vitamin D deficiency 04/01/2013   Outpatient Encounter Prescriptions as of 10/29/2014  Medication Sig  . atorvastatin (LIPITOR) 40 MG tablet TAKE ONE TABLET BY MOUTH ONE TIME DAILY  . cholecalciferol (VITAMIN D) 1000 UNITS tablet Take 5,000 Units by mouth daily. 3 times weekly  . Coenzyme Q10 (CO Q 10 PO) Take 1 tablet by mouth daily.  Marland Kitchen. EPINEPHrine 0.3 mg/0.3 mL IJ SOAJ injection Inject 0.3 mLs (0.3 mg total) into the muscle as needed (for anaphylactic / allergic reaction).  . quinapril (ACCUPRIL) 20 MG tablet TAKE ONE TABLET BY MOUTH ONE TIME DAILY  . valACYclovir (VALTREX) 1000 MG tablet TAKE ONE TABLET BY MOUTH DAILY AS NEEDED  . [DISCONTINUED] scopolamine (TRANSDERM-SCOP) 1 MG/3DAYS Place 1 patch (1.5 mg total) onto the skin every 3 (three) days.   No facility-administered encounter  medications on file as of 10/29/2014.     Review of Systems  Constitutional: Negative.   HENT: Negative.   Eyes: Negative.   Respiratory: Negative.   Cardiovascular: Negative.   Gastrointestinal: Negative.   Endocrine: Negative.   Genitourinary: Negative.   Musculoskeletal: Negative.   Skin: Negative.   Allergic/Immunologic: Negative.   Neurological: Negative.   Hematological: Negative.   Psychiatric/Behavioral: Negative.        Objective:   Physical Exam  Constitutional: She is oriented to person, place, and time. She appears well-developed and well-nourished.  HENT:  Head: Normocephalic and atraumatic.  Right Ear: External ear normal.  Left Ear: External ear normal.  Nose: Nose normal.  Mouth/Throat: Oropharynx is clear and moist.  Eyes: Conjunctivae and EOM are normal. Pupils are equal, round, and reactive to light. Right eye exhibits no discharge. Left eye exhibits no discharge. No scleral icterus.  Neck: Normal range of motion. Neck supple. No thyromegaly present.  Cardiovascular: Normal rate, regular rhythm and intact distal pulses.  Exam reveals no gallop and no friction rub.   No murmur heard. At 72/m  Pulmonary/Chest: Effort normal and breath sounds normal. No respiratory distress. She has no wheezes. She has no rales. She exhibits no tenderness.  Abdominal: Soft. Bowel sounds are normal. She exhibits no mass. There is no tenderness. There is no rebound and no guarding.  The abdomen was nontender without masses or organ enlargement  Musculoskeletal: Normal range of motion. She exhibits no edema.  Lymphadenopathy:  She has no cervical adenopathy.  Neurological: She is alert and oriented to person, place, and time. She has normal reflexes. No cranial nerve deficit.  Skin: Skin is warm and dry. No rash noted.  Psychiatric: She has a normal mood and affect. Her behavior is normal. Judgment and thought content normal.  Nursing note and vitals reviewed.  BP 132/81  mmHg  Pulse 76  Temp(Src) 97.9 F (36.6 C) (Oral)  Ht 5\' 4"  (1.626 m)  Wt 140 lb (63.504 kg)  BMI 24.02 kg/m2        Assessment & Plan:  1. Hyperlipidemia -The patient's lab work was reviewed with her and all of her cholesterol numbers are excellent even though she has some concern about taking atorvastatin, her brother died of a heart attack at an early age and I recommended that she should stay on the current dose of atorvastatin and we will be monitoring her blood sugars regularly.  2. Essential hypertension -Blood pressure is good today and she should continue with her current medication.  3. Vitamin D deficiency -The vitamin D level is also good at the current dose of medicines that she is taking and she should continue with this.  4. Heartburn -The patient will ponder and decide if she will also have an endoscopy regarding her heartburn swallowing issues. She will call us back and let us know and we will arrange to do this with her gastroenterologist who is Dr. Madilyn FiremanHayes.  No orders of the defined types were placed in this encounter.   Patient Instructions   Continue current medications. Continue good therapeutic lifestyle changes which include good diet and exercise. Fall precautions discussed with patient. If an FOBT was given today- please return it to our front desk. If you are over 62 years old - you may need Prevnar 13 or the adult Pneumonia vaccine.  Flu Shots are still available at our office. If you still haven't had one please call to set up a nurse visit to get one.   After your visit with us today you will receive a survey in the mail or online from [employer] regarding your care with us. Please take a moment to fill this out. Your feedback is very important to us as you can help us better understand your patient needs as well as improve your experience and satisfaction. WE CARE ABOUT YOU!!!   The patient should continue taking her atorvastatin along with  practicing aggressive therapeutic lifestyle changes which include diet and exercise She should continue her current dose of vitamin D She should check with her insurance regarding the Prevnar vaccine The visit was a good visit today and I am pleased with everything that she is doing. If she continues to have problems with her swallowing, she should give us a call back and we will arrange for her to see the gastroenterologist for a possible endoscopy  Remember to watch the skin on the fruits that you're eating and pedal the fresh fruits and see if this makes a difference with the swallowing   Nyra Capeson W. Moore MD

## 2014-10-29 NOTE — Patient Instructions (Addendum)
  Continue current medications. Continue good therapeutic lifestyle changes which include good diet and exercise. Fall precautions discussed with patient. If an FOBT was given today- please return it to our front desk. If you are over 560 years old - you may need Prevnar 13 or the adult Pneumonia vaccine.  Flu Shots are still available at our office. If you still haven't had one please call to set up a nurse visit to get one.   After your visit with us today you will receive a survey in the mail or online from [employer] regarding your care with us. Please take a moment to fill this out. Your feedback is very important to us as you can help us better understand your patient needs as well as improve your experience and satisfaction. WE CARE ABOUT YOU!!!   The patient should continue taking her atorvastatin along with practicing aggressive therapeutic lifestyle changes which include diet and exercise She should continue her current dose of vitamin D She should check with her insurance regarding the Prevnar vaccine The visit was a good visit today and I am pleased with everything that she is doing. If she continues to have problems with her swallowing, she should give us a call back and we will arrange for her to see the gastroenterologist for a possible endoscopy  Remember to watch the skin on the fruits that you're eating and pedal the fresh fruits and see if this makes a difference with the swallowing

## 2014-11-27 ENCOUNTER — Other Ambulatory Visit: Payer: Self-pay | Admitting: Family Medicine

## 2015-01-15 ENCOUNTER — Encounter: Payer: Self-pay | Admitting: *Deleted

## 2015-02-05 ENCOUNTER — Other Ambulatory Visit: Payer: Self-pay | Admitting: Family Medicine

## 2015-02-20 ENCOUNTER — Encounter: Payer: Self-pay | Admitting: Family Medicine

## 2015-04-23 ENCOUNTER — Other Ambulatory Visit (INDEPENDENT_AMBULATORY_CARE_PROVIDER_SITE_OTHER): Payer: BC Managed Care – PPO

## 2015-04-23 DIAGNOSIS — E785 Hyperlipidemia, unspecified: Secondary | ICD-10-CM

## 2015-04-23 DIAGNOSIS — I10 Essential (primary) hypertension: Secondary | ICD-10-CM

## 2015-04-23 DIAGNOSIS — E559 Vitamin D deficiency, unspecified: Secondary | ICD-10-CM

## 2015-04-23 NOTE — Progress Notes (Signed)
Lab only 

## 2015-04-24 LAB — CBC WITH DIFFERENTIAL/PLATELET
BASOS ABS: 0 10*3/uL (ref 0.0–0.2)
Basos: 0 %
EOS (ABSOLUTE): 0.2 10*3/uL (ref 0.0–0.4)
Eos: 3 %
HEMOGLOBIN: 12.5 g/dL (ref 11.1–15.9)
Hematocrit: 38.9 % (ref 34.0–46.6)
IMMATURE GRANS (ABS): 0 10*3/uL (ref 0.0–0.1)
Immature Granulocytes: 0 %
LYMPHS: 34 %
Lymphocytes Absolute: 2.3 10*3/uL (ref 0.7–3.1)
MCH: 27.8 pg (ref 26.6–33.0)
MCHC: 32.1 g/dL (ref 31.5–35.7)
MCV: 86 fL (ref 79–97)
MONOCYTES: 6 %
Monocytes Absolute: 0.4 10*3/uL (ref 0.1–0.9)
NEUTROS ABS: 4 10*3/uL (ref 1.4–7.0)
Neutrophils: 57 %
Platelets: 306 10*3/uL (ref 150–379)
RBC: 4.5 x10E6/uL (ref 3.77–5.28)
RDW: 14.8 % (ref 12.3–15.4)
WBC: 7 10*3/uL (ref 3.4–10.8)

## 2015-04-24 LAB — BMP8+EGFR
BUN/Creatinine Ratio: 20 (ref 11–26)
BUN: 14 mg/dL (ref 8–27)
CALCIUM: 9.3 mg/dL (ref 8.7–10.3)
CHLORIDE: 103 mmol/L (ref 97–106)
CO2: 28 mmol/L (ref 18–29)
Creatinine, Ser: 0.71 mg/dL (ref 0.57–1.00)
GFR, EST AFRICAN AMERICAN: 106 mL/min/{1.73_m2} (ref >59)
GFR, EST NON AFRICAN AMERICAN: 92 mL/min/{1.73_m2} (ref >59)
Glucose: 84 mg/dL (ref 65–99)
Potassium: 3.8 mmol/L (ref 3.5–5.2)
Sodium: 144 mmol/L (ref 136–144)

## 2015-04-24 LAB — NMR, LIPOPROFILE
CHOLESTEROL: 146 mg/dL (ref 100–199)
HDL Cholesterol by NMR: 50 mg/dL (ref >39)
HDL Particle Number: 34 umol/L (ref >=30.5)
LDL PARTICLE NUMBER: 953 nmol/L (ref <1000)
LDL Size: 20.8 nm (ref >20.5)
LDL-C: 74 mg/dL (ref 0–99)
LP-IR Score: 41 (ref <=45)
Small LDL Particle Number: 433 nmol/L (ref <=527)
TRIGLYCERIDES BY NMR: 110 mg/dL (ref 0–149)

## 2015-04-24 LAB — VITAMIN D 25 HYDROXY (VIT D DEFICIENCY, FRACTURES): Vit D, 25-Hydroxy: 47.5 ng/mL (ref 30.0–100.0)

## 2015-04-29 ENCOUNTER — Ambulatory Visit (INDEPENDENT_AMBULATORY_CARE_PROVIDER_SITE_OTHER): Payer: BC Managed Care – PPO | Admitting: Family Medicine

## 2015-04-29 ENCOUNTER — Encounter: Payer: Self-pay | Admitting: Family Medicine

## 2015-04-29 VITALS — BP 136/84 | HR 88 | Temp 97.8°F | Ht 64.0 in | Wt 137.0 lb

## 2015-04-29 DIAGNOSIS — J3089 Other allergic rhinitis: Secondary | ICD-10-CM

## 2015-04-29 DIAGNOSIS — Z1212 Encounter for screening for malignant neoplasm of rectum: Secondary | ICD-10-CM

## 2015-04-29 DIAGNOSIS — E785 Hyperlipidemia, unspecified: Secondary | ICD-10-CM

## 2015-04-29 DIAGNOSIS — I10 Essential (primary) hypertension: Secondary | ICD-10-CM

## 2015-04-29 DIAGNOSIS — J040 Acute laryngitis: Secondary | ICD-10-CM | POA: Diagnosis not present

## 2015-04-29 DIAGNOSIS — E559 Vitamin D deficiency, unspecified: Secondary | ICD-10-CM

## 2015-04-29 MED ORDER — VALACYCLOVIR HCL 1 G PO TABS: 1000.0000 mg | ORAL_TABLET | Freq: Every day | ORAL | Status: DC | PRN

## 2015-04-29 MED ORDER — AZITHROMYCIN 250 MG PO TABS: ORAL_TABLET | ORAL | Status: DC

## 2015-04-29 NOTE — Patient Instructions (Addendum)
Continue current medications. Continue good therapeutic lifestyle changes which include good diet and exercise. Fall precautions discussed with patient. If an FOBT was given today- please return it to our front desk. If you are over 343 years old - you may need Prevnar 13 or the adult Pneumonia vaccine.  **Flu shots are available--- please call and schedule a FLU-CLINIC appointment**  After your visit with us today you will receive a survey in the mail or online from [employer] regarding your care with us. Please take a moment to fill this out. Your feedback is very important to us as you can help us better understand your patient needs as well as improve your experience and satisfaction. WE CARE ABOUT YOU!!!   The patient should practice voice rest and should gargle with some warm salty water for the next 2 or 3 days several times daily She should drink plenty of fluids and stay well hydrated She should take Tylenol for aches pains and fever She should take the Zantac or ranitidine 150 mg, the equate brand twice daily before breakfast and supper. If her swallowing problems continue beyond 1 month she should get back in touch with us and we should arrange for her to have an endoscopy We will call her with the results of the fecal occult blood test as soon as that becomes available She should check her blood pressures occasionally at home and bring these readings with her to her office visits. She should watch her sodium intake closely. She should check with her insurance regarding the Prevnar vaccine Use nasal saline spray frequently through the day Use Mucinex maximum strength, blue and white in color, 1 twice daily with a large glass of water for cough and congestion

## 2015-04-29 NOTE — Addendum Note (Signed)
Addended by: Orma RenderHODGES, Kinzly Pierrelouis F on: 04/29/2015 11:25 AM   Modules accepted: Orders

## 2015-04-29 NOTE — Progress Notes (Signed)
Subjective:    Patient ID: Candice Beck, female    DOB: February 15, 1951, 64 y.o.   MRN: [id number]  HPI Pt here for follow up and management of chronic medical problems which includes hypertension and hypelipidemia. She is taking medications regularly. The patient complains of a voice change and some indigestion today. She has hoarseness. She is requesting a refill on her Valtrex. She returned her FOBT today and we will go over the lab work with her during the visit. She refuses to take the flu vaccine. The patient denies chest pain shortness of breath trouble passing her water or blood in the stool or black tarry bowel movements. She has had some ongoing issues with swallowing and possibly some reflux. She went on a cruise recently and this gave her a lot of problems during the cruise. She is doing somewhat better presently. She is not taking any kind of H2 blocker or PPI.      Patient Active Problem List   Diagnosis Date Noted  . Osteopenia 03/07/2014  . Hypertension 04/01/2013  . Hyperlipidemia 04/01/2013  . Vitamin D deficiency 04/01/2013   Outpatient Encounter Prescriptions as of 04/29/2015  Medication Sig  . atorvastatin (LIPITOR) 40 MG tablet TAKE ONE TABLET BY MOUTH ONE TIME DAILY  . cholecalciferol (VITAMIN D) 1000 UNITS tablet Take 5,000 Units by mouth daily. 3 times weekly  . Coenzyme Q10 (CO Q 10 PO) Take 1 tablet by mouth daily.  Marland Kitchen EPINEPHrine 0.3 mg/0.3 mL IJ SOAJ injection Inject 0.3 mLs (0.3 mg total) into the muscle as needed (for anaphylactic / allergic reaction).  . quinapril (ACCUPRIL) 20 MG tablet TAKE ONE TABLET BY MOUTH ONE TIME DAILY  . valACYclovir (VALTREX) 1000 MG tablet TAKE ONE TABLET BY MOUTH DAILY AS NEEDED   No facility-administered encounter medications on file as of 04/29/2015.      Review of Systems  Constitutional: Negative.   HENT: Positive for voice change.   Eyes: Negative.   Respiratory: Negative.   Cardiovascular: Negative.     Gastrointestinal: Negative.        Indigestion  Endocrine: Negative.   Genitourinary: Negative.   Musculoskeletal: Negative.   Skin: Negative.   Allergic/Immunologic: Negative.   Neurological: Negative.   Hematological: Negative.   Psychiatric/Behavioral: Negative.        Objective:   Physical Exam  Constitutional: She is oriented to person, place, and time. She appears well-developed and well-nourished. No distress.  HENT:  Head: Normocephalic and atraumatic.  Right Ear: External ear normal.  Left Ear: External ear normal.  Mouth/Throat: Oropharynx is clear and moist.  Nasal congestion right greater than left  Eyes: Conjunctivae and EOM are normal. Pupils are equal, round, and reactive to light. Right eye exhibits no discharge. Left eye exhibits no discharge. No scleral icterus.  Neck: Normal range of motion. Neck supple. No thyromegaly present.  Cardiovascular: Normal rate, regular rhythm, normal heart sounds and intact distal pulses.  Exam reveals no gallop and no friction rub.   No murmur heard. The heart is regular at 72/m  Pulmonary/Chest: Effort normal and breath sounds normal. No respiratory distress. She has no wheezes. She has no rales. She exhibits no tenderness.  Dry cough no rales or wheezes  Abdominal: Soft. Bowel sounds are normal. She exhibits no mass. There is tenderness. There is no rebound and no guarding.  Slight epigastric tenderness otherwise no liver or organ enlargement and no masses and no bruits  Musculoskeletal: Normal range of motion. She exhibits no  edema or tenderness.  Lymphadenopathy:    She has no cervical adenopathy.  Neurological: She is alert and oriented to person, place, and time. She has normal reflexes. No cranial nerve deficit.  Skin: Skin is warm and dry. No rash noted.  Psychiatric: She has a normal mood and affect. Her behavior is normal. Judgment and thought content normal.  Nursing note and vitals reviewed.  BP 142/87 mmHg  Pulse  88  Temp(Src) 97.8 F (36.6 C) (Oral)  Ht 5\' 4"  (1.626 m)  Wt 137 lb (62.143 kg)  BMI 23.50 kg/m2  Repeat blood pressure 136/84    Assessment & Plan:  1. Hyperlipidemia -Continue current treatment  2. Essential hypertension -The blood pressure is good today she will continue with current treatment  3. Vitamin D deficiency -The vitamin D level was good with recent lab work and she will continue with current treatment  4. Laryngitis -The patient will practice voice rest gargle with warm salty water and take Tylenol for aches pains and fever and drink plenty of fluids  5. Other allergic rhinitis -She will use nasal saline as needed for head congestion  Patient Instructions  Continue current medications. Continue good therapeutic lifestyle changes which include good diet and exercise. Fall precautions discussed with patient. If an FOBT was given today- please return it to our front desk. If you are over 62 years old - you may need Prevnar 13 or the adult Pneumonia vaccine.  **Flu shots are available--- please call and schedule a FLU-CLINIC appointment**  After your visit with us today you will receive a survey in the mail or online from [employer] regarding your care with us. Please take a moment to fill this out. Your feedback is very important to us as you can help us better understand your patient needs as well as improve your experience and satisfaction. WE CARE ABOUT YOU!!!   The patient should practice voice rest and should gargle with some warm salty water for the next 2 or 3 days several times daily She should drink plenty of fluids and stay well hydrated She should take Tylenol for aches pains and fever She should take the Zantac or ranitidine 150 mg, the equate brand twice daily before breakfast and supper. If her swallowing problems continue beyond 1 month she should get back in touch with us and we should arrange for her to have an endoscopy We will call her with the  results of the fecal occult blood test as soon as that becomes available She should check her blood pressures occasionally at home and bring these readings with her to her office visits. She should watch her sodium intake closely. She should check with her insurance regarding the Prevnar vaccine Use nasal saline spray frequently through the day Use Mucinex maximum strength, blue and white in color, 1 twice daily with a large glass of water for cough and congestion   Nyra Capeson W. Versie Fleener MD

## 2015-04-30 ENCOUNTER — Ambulatory Visit: Payer: BC Managed Care – PPO | Admitting: Family Medicine

## 2015-04-30 LAB — SPECIMEN STATUS REPORT

## 2015-04-30 LAB — HFP7+2AC
ALBUMIN: 4.4 g/dL (ref 3.6–4.8)
ALK PHOS: 92 IU/L (ref 39–117)
ALT: 14 IU/L (ref 0–32)
AST: 18 IU/L (ref 0–40)
BILIRUBIN, DIRECT: 0.09 mg/dL (ref 0.00–0.40)
Bilirubin Total: 0.3 mg/dL (ref 0.0–1.2)
GGT: 27 IU/L (ref 0–60)
LDH: 174 IU/L (ref 119–226)
TOTAL PROTEIN: 6.7 g/dL (ref 6.0–8.5)

## 2015-05-03 ENCOUNTER — Other Ambulatory Visit: Payer: Self-pay | Admitting: Family Medicine

## 2015-05-03 LAB — FECAL OCCULT BLOOD, IMMUNOCHEMICAL: Fecal Occult Bld: NEGATIVE

## 2015-06-01 ENCOUNTER — Other Ambulatory Visit: Payer: Self-pay | Admitting: Family Medicine

## 2015-06-23 ENCOUNTER — Other Ambulatory Visit: Payer: Self-pay

## 2015-06-23 MED ORDER — QUINAPRIL HCL 20 MG PO TABS: 20.0000 mg | ORAL_TABLET | Freq: Every day | ORAL | Status: DC

## 2015-06-23 MED ORDER — ATORVASTATIN CALCIUM 40 MG PO TABS: 40.0000 mg | ORAL_TABLET | Freq: Every day | ORAL | Status: DC

## 2015-06-25 ENCOUNTER — Other Ambulatory Visit: Payer: Self-pay | Admitting: *Deleted

## 2015-06-30 ENCOUNTER — Telehealth: Payer: Self-pay | Admitting: Family Medicine

## 2015-06-30 MED ORDER — ATORVASTATIN CALCIUM 40 MG PO TABS: 40.0000 mg | ORAL_TABLET | Freq: Every day | ORAL | Status: DC

## 2015-06-30 MED ORDER — QUINAPRIL HCL 20 MG PO TABS: 20.0000 mg | ORAL_TABLET | Freq: Every day | ORAL | Status: DC

## 2015-06-30 NOTE — Telephone Encounter (Signed)
Stp and advised the rx's were sent over to [facility] as requested.

## 2015-10-23 ENCOUNTER — Other Ambulatory Visit: Payer: BC Managed Care – PPO

## 2015-10-23 DIAGNOSIS — E559 Vitamin D deficiency, unspecified: Secondary | ICD-10-CM

## 2015-10-23 DIAGNOSIS — E785 Hyperlipidemia, unspecified: Secondary | ICD-10-CM

## 2015-10-23 DIAGNOSIS — I10 Essential (primary) hypertension: Secondary | ICD-10-CM

## 2015-10-24 LAB — CBC WITH DIFFERENTIAL/PLATELET
BASOS: 1 %
Basophils Absolute: 0 10*3/uL (ref 0.0–0.2)
EOS (ABSOLUTE): 0.2 10*3/uL (ref 0.0–0.4)
Eos: 4 %
HEMATOCRIT: 36.7 % (ref 34.0–46.6)
HEMOGLOBIN: 12.3 g/dL (ref 11.1–15.9)
IMMATURE GRANS (ABS): 0 10*3/uL (ref 0.0–0.1)
IMMATURE GRANULOCYTES: 0 %
LYMPHS ABS: 2 10*3/uL (ref 0.7–3.1)
Lymphs: 37 %
MCH: 29.4 pg (ref 26.6–33.0)
MCHC: 33.5 g/dL (ref 31.5–35.7)
MCV: 88 fL (ref 79–97)
MONOS ABS: 0.4 10*3/uL (ref 0.1–0.9)
Monocytes: 8 %
NEUTROS ABS: 2.8 10*3/uL (ref 1.4–7.0)
NEUTROS PCT: 50 %
Platelets: 286 10*3/uL (ref 150–379)
RBC: 4.18 x10E6/uL (ref 3.77–5.28)
RDW: 14.2 % (ref 12.3–15.4)
WBC: 5.4 10*3/uL (ref 3.4–10.8)

## 2015-10-24 LAB — HEPATIC FUNCTION PANEL
ALBUMIN: 4.2 g/dL (ref 3.6–4.8)
ALK PHOS: 83 IU/L (ref 39–117)
ALT: 13 IU/L (ref 0–32)
AST: 20 IU/L (ref 0–40)
BILIRUBIN TOTAL: 0.6 mg/dL (ref 0.0–1.2)
Bilirubin, Direct: 0.15 mg/dL (ref 0.00–0.40)
Total Protein: 6.5 g/dL (ref 6.0–8.5)

## 2015-10-24 LAB — BMP8+EGFR
BUN / CREAT RATIO: 17 (ref 12–28)
BUN: 11 mg/dL (ref 8–27)
CO2: 26 mmol/L (ref 18–29)
Calcium: 9.5 mg/dL (ref 8.7–10.3)
Chloride: 102 mmol/L (ref 96–106)
Creatinine, Ser: 0.65 mg/dL (ref 0.57–1.00)
GFR calc Af Amer: 110 mL/min/{1.73_m2} (ref >59)
GFR, EST NON AFRICAN AMERICAN: 95 mL/min/{1.73_m2} (ref >59)
Glucose: 85 mg/dL (ref 65–99)
POTASSIUM: 3.6 mmol/L (ref 3.5–5.2)
SODIUM: 143 mmol/L (ref 134–144)

## 2015-10-24 LAB — LIPID PANEL
CHOL/HDL RATIO: 2.7 ratio (ref 0.0–4.4)
Cholesterol, Total: 133 mg/dL (ref 100–199)
HDL: 49 mg/dL (ref >39)
LDL Calculated: 60 mg/dL (ref 0–99)
Triglycerides: 118 mg/dL (ref 0–149)
VLDL Cholesterol Cal: 24 mg/dL (ref 5–40)

## 2015-10-24 LAB — VITAMIN D 25 HYDROXY (VIT D DEFICIENCY, FRACTURES): Vit D, 25-Hydroxy: 41.8 ng/mL (ref 30.0–100.0)

## 2015-10-28 ENCOUNTER — Ambulatory Visit (INDEPENDENT_AMBULATORY_CARE_PROVIDER_SITE_OTHER): Payer: BC Managed Care – PPO

## 2015-10-28 ENCOUNTER — Encounter: Payer: Self-pay | Admitting: Family Medicine

## 2015-10-28 ENCOUNTER — Ambulatory Visit (INDEPENDENT_AMBULATORY_CARE_PROVIDER_SITE_OTHER): Payer: BC Managed Care – PPO | Admitting: Family Medicine

## 2015-10-28 VITALS — BP 136/77 | HR 72 | Temp 97.5°F | Ht 64.0 in | Wt 140.6 lb

## 2015-10-28 DIAGNOSIS — H6993 Unspecified Eustachian tube disorder, bilateral: Secondary | ICD-10-CM

## 2015-10-28 DIAGNOSIS — J301 Allergic rhinitis due to pollen: Secondary | ICD-10-CM | POA: Diagnosis not present

## 2015-10-28 DIAGNOSIS — I10 Essential (primary) hypertension: Secondary | ICD-10-CM | POA: Diagnosis not present

## 2015-10-28 DIAGNOSIS — E785 Hyperlipidemia, unspecified: Secondary | ICD-10-CM | POA: Diagnosis not present

## 2015-10-28 DIAGNOSIS — H6983 Other specified disorders of Eustachian tube, bilateral: Secondary | ICD-10-CM

## 2015-10-28 DIAGNOSIS — E559 Vitamin D deficiency, unspecified: Secondary | ICD-10-CM

## 2015-10-28 MED ORDER — EPINEPHRINE 0.3 MG/0.3ML IJ SOAJ: 0.3000 mg | INTRAMUSCULAR | Status: DC | PRN

## 2015-10-28 NOTE — Progress Notes (Signed)
Subjective:    Patient ID: Candice Beck, female    DOB: 06-Oct-1950, 65 y.o.   MRN: [id number]  HPI Patient is here today for a 6 month follow up on hyperlipidemia, hypertension, and vitamin d deficiency.  Patient gave blood on 4/12 and she passed out. Patient states that she started vomiting when she woke up and that for a week and a half after that she had a headache and ear pain. Patient states that her ears have still been bothering her up until yesterday. The patient has Nasacort at home but has not been using this. She denies any chest pain shortness of breath trouble swallowing heartburn indigestion nausea vomiting diarrhea blood in the stool or black tarry bowel movements. She is passing her water without problems. She does occasionally take some Zantac but rarely. All of her lab work was reviewed with her today and everything was excellent. It is important to note that her hemoglobin usually runs at the low end of the normal range around 12. She was told she gave blood to do some special finger movements and that when they check the hemoglobin prior to given the blood it was 15. Her hemoglobins have never been 15 in the office. It may be that giving the blood with the lower hemoglobin certainly could play a role with causing her to pass out after given the blood. She will keep this in mind with future phlebotomies.   Review of Systems  Constitutional: Negative.   HENT: Positive for ear pain.   Eyes: Negative.   Respiratory: Negative.   Cardiovascular: Negative.   Gastrointestinal: Negative.   Endocrine: Negative.   Genitourinary: Negative.   Musculoskeletal: Negative.   Skin: Negative.   Allergic/Immunologic: Negative.   Neurological: Negative.   Hematological: Negative.   Psychiatric/Behavioral: Negative.         Patient Active Problem List   Diagnosis Date Noted  . Osteopenia 03/07/2014  . Hypertension 04/01/2013  . Hyperlipidemia 04/01/2013  . Vitamin D deficiency  04/01/2013   Outpatient Encounter Prescriptions as of 10/28/2015  Medication Sig  . atorvastatin (LIPITOR) 40 MG tablet Take 1 tablet (40 mg total) by mouth daily.  . cholecalciferol (VITAMIN D) 1000 UNITS tablet Take 5,000 Units by mouth daily. 3 times weekly  . Coenzyme Q10 (CO Q 10 PO) Take 1 tablet by mouth daily.  Marland Kitchen EPINEPHrine 0.3 mg/0.3 mL IJ SOAJ injection Inject 0.3 mLs (0.3 mg total) into the muscle as needed (for anaphylactic / allergic reaction).  . quinapril (ACCUPRIL) 20 MG tablet Take 1 tablet (20 mg total) by mouth daily.  . valACYclovir (VALTREX) 1000 MG tablet Take 1 tablet (1,000 mg total) by mouth daily as needed.  . [DISCONTINUED] EPINEPHrine 0.3 mg/0.3 mL IJ SOAJ injection Inject 0.3 mLs (0.3 mg total) into the muscle as needed (for anaphylactic / allergic reaction).  . [DISCONTINUED] azithromycin (ZITHROMAX) 250 MG tablet As directed   No facility-administered encounter medications on file as of 10/28/2015.        Objective:   Physical Exam  Constitutional: She is oriented to person, place, and time. She appears well-developed and well-nourished. No distress.  HENT:  Head: Normocephalic and atraumatic.  Right Ear: External ear normal.  Left Ear: External ear normal.  Mouth/Throat: No oropharyngeal exudate.  Nasal congestion bilaterally  Eyes: Conjunctivae and EOM are normal. Pupils are equal, round, and reactive to light. Right eye exhibits no discharge. Left eye exhibits no discharge. No scleral icterus.  Neck: Normal range of  motion. Neck supple. No thyromegaly present.  No bruits thyromegaly or adenopathy  Cardiovascular: Normal rate, regular rhythm, normal heart sounds and intact distal pulses.   No murmur heard. The heart has a regular rate and rhythm at 72/m  Pulmonary/Chest: Effort normal and breath sounds normal. No respiratory distress. She has no wheezes. She has no rales. She exhibits no tenderness.  Clear anteriorly and posteriorly  Abdominal: Soft.  Bowel sounds are normal. She exhibits no mass. There is no tenderness. There is no rebound and no guarding.  No epigastric tenderness liver or spleen enlargement or suprapubic tenderness  Musculoskeletal: Normal range of motion. She exhibits no edema.  Lymphadenopathy:    She has no cervical adenopathy.  Neurological: She is alert and oriented to person, place, and time. She has normal reflexes. No cranial nerve deficit.  Skin: Skin is warm and dry. No rash noted.  Psychiatric: She has a normal mood and affect. Her behavior is normal. Judgment and thought content normal.  Nursing note and vitals reviewed.   BP 136/77 mmHg  Pulse 72  Temp(Src) 97.5 F (36.4 C) (Oral)  Ht 5\' 4"  (1.626 m)  Wt 140 lb 9.6 oz (63.776 kg)  BMI 24.12 kg/m2  [facility] reading (PRIMARY) by  Dr. Tracie HarrierMoore-chest x-ray with results pending                                       Assessment & Plan:  1. Essential hypertension -The blood pressure is good today and she will continue with current treatment - DG Chest 2 View; Future  2. Vitamin D deficiency -The vitamin D level was good and she will continue with current treatment  3. Hyperlipidemia -Cholesterol numbers were excellent and she will continue with current treatment  4. Eustachian tube dysfunction, bilateral -Use Nasonex more regularly for the month of June  5. Allergic rhinitis due to pollen -Use Nasonex as directed and avoid irritating environments  Meds ordered this encounter  Medications  . EPINEPHrine 0.3 mg/0.3 mL IJ SOAJ injection    Sig: Inject 0.3 mLs (0.3 mg total) into the muscle as needed (for anaphylactic / allergic reaction).    Dispense:  1 Device    Refill:  0   Patient Instructions  Stay on current medications Reconsider giving any more blood because of the syncopal episode Use Nasacort more regularly at least through the month of June Stay well hydrated Avoid irritating environments and wear respiratory protection   Nyra Capeson W.  Moore MD

## 2015-10-28 NOTE — Patient Instructions (Signed)
Stay on current medications Reconsider giving any more blood because of the syncopal episode Use Nasacort more regularly at least through the month of June Stay well hydrated Avoid irritating environments and wear respiratory protection

## 2015-11-19 ENCOUNTER — Other Ambulatory Visit: Payer: Self-pay | Admitting: Family Medicine

## 2016-02-08 ENCOUNTER — Other Ambulatory Visit: Payer: Self-pay | Admitting: Family Medicine

## 2016-05-06 ENCOUNTER — Ambulatory Visit: Payer: BC Managed Care – PPO | Admitting: Family Medicine

## 2016-05-10 ENCOUNTER — Other Ambulatory Visit: Payer: Self-pay | Admitting: Family Medicine

## 2016-05-31 ENCOUNTER — Other Ambulatory Visit: Payer: BC Managed Care – PPO

## 2016-05-31 DIAGNOSIS — E785 Hyperlipidemia, unspecified: Secondary | ICD-10-CM

## 2016-05-31 DIAGNOSIS — I10 Essential (primary) hypertension: Secondary | ICD-10-CM

## 2016-06-01 LAB — BMP8+EGFR
BUN / CREAT RATIO: 17 (ref 12–28)
BUN: 11 mg/dL (ref 8–27)
CO2: 26 mmol/L (ref 18–29)
CREATININE: 0.65 mg/dL (ref 0.57–1.00)
Calcium: 9.3 mg/dL (ref 8.7–10.3)
Chloride: 102 mmol/L (ref 96–106)
GFR, EST AFRICAN AMERICAN: 109 mL/min/{1.73_m2} (ref >59)
GFR, EST NON AFRICAN AMERICAN: 95 mL/min/{1.73_m2} (ref >59)
Glucose: 87 mg/dL (ref 65–99)
Potassium: 3.6 mmol/L (ref 3.5–5.2)
Sodium: 143 mmol/L (ref 134–144)

## 2016-06-01 LAB — CBC WITH DIFFERENTIAL/PLATELET
BASOS: 1 %
Basophils Absolute: 0 10*3/uL (ref 0.0–0.2)
EOS (ABSOLUTE): 0.2 10*3/uL (ref 0.0–0.4)
EOS: 3 %
HEMATOCRIT: 39.1 % (ref 34.0–46.6)
HEMOGLOBIN: 13.1 g/dL (ref 11.1–15.9)
IMMATURE GRANS (ABS): 0 10*3/uL (ref 0.0–0.1)
Immature Granulocytes: 1 %
LYMPHS ABS: 2 10*3/uL (ref 0.7–3.1)
Lymphs: 31 %
MCH: 29.9 pg (ref 26.6–33.0)
MCHC: 33.5 g/dL (ref 31.5–35.7)
MCV: 89 fL (ref 79–97)
MONOCYTES: 6 %
Monocytes Absolute: 0.4 10*3/uL (ref 0.1–0.9)
NEUTROS ABS: 4 10*3/uL (ref 1.4–7.0)
Neutrophils: 58 %
Platelets: 295 10*3/uL (ref 150–379)
RBC: 4.38 x10E6/uL (ref 3.77–5.28)
RDW: 13.9 % (ref 12.3–15.4)
WBC: 6.6 10*3/uL (ref 3.4–10.8)

## 2016-06-01 LAB — LIPID PANEL
CHOL/HDL RATIO: 3 ratio (ref 0.0–4.4)
CHOLESTEROL TOTAL: 131 mg/dL (ref 100–199)
HDL: 44 mg/dL (ref >39)
LDL Calculated: 71 mg/dL (ref 0–99)
TRIGLYCERIDES: 79 mg/dL (ref 0–149)
VLDL Cholesterol Cal: 16 mg/dL (ref 5–40)

## 2016-06-01 LAB — HEPATIC FUNCTION PANEL
ALK PHOS: 91 IU/L (ref 39–117)
ALT: 14 IU/L (ref 0–32)
AST: 14 IU/L (ref 0–40)
Albumin: 4.4 g/dL (ref 3.6–4.8)
BILIRUBIN, DIRECT: 0.11 mg/dL (ref 0.00–0.40)
Bilirubin Total: 0.4 mg/dL (ref 0.0–1.2)
Total Protein: 6.6 g/dL (ref 6.0–8.5)

## 2016-06-02 ENCOUNTER — Encounter: Payer: Self-pay | Admitting: Family Medicine

## 2016-06-02 ENCOUNTER — Ambulatory Visit (INDEPENDENT_AMBULATORY_CARE_PROVIDER_SITE_OTHER): Payer: BC Managed Care – PPO | Admitting: Family Medicine

## 2016-06-02 VITALS — BP 120/86 | HR 78 | Temp 97.5°F | Ht 64.0 in | Wt 136.0 lb

## 2016-06-02 DIAGNOSIS — E559 Vitamin D deficiency, unspecified: Secondary | ICD-10-CM | POA: Diagnosis not present

## 2016-06-02 DIAGNOSIS — E7800 Pure hypercholesterolemia, unspecified: Secondary | ICD-10-CM | POA: Diagnosis not present

## 2016-06-02 DIAGNOSIS — I10 Essential (primary) hypertension: Secondary | ICD-10-CM

## 2016-06-02 NOTE — Progress Notes (Signed)
Subjective:    Patient ID: Candice Beck, female    DOB: 01/10/1951, 65 y.o.   MRN: [id number]  HPI Pt here for follow up and management of chronic medical problems which includes hyperlipidemia and hypertension. She is taking medication regularly.The patient has had recent lab work done and this will be reviewed with her during the visit today. The renal function was good with a good blood sugar and creatinine. The CBC was within normal limits as well as the liver function tests. All cholesterol numbers with traditional lipid testing were good. These will be reviewed with her and she will be given a copy of the report. She has no specific complaints today and is due to return an FOBT. She refuses to take the flu shot and the Prevnar vaccine.     Patient Active Problem List   Diagnosis Date Noted  . Osteopenia 03/07/2014  . Hypertension 04/01/2013  . Hyperlipidemia 04/01/2013  . Vitamin D deficiency 04/01/2013   Outpatient Encounter Prescriptions as of 06/02/2016  Medication Sig  . atorvastatin (LIPITOR) 40 MG tablet TAKE 1 TABLET DAILY  . cholecalciferol (VITAMIN D) 1000 UNITS tablet Take 5,000 Units by mouth daily. 3 times weekly  . Coenzyme Q10 (CO Q 10 PO) Take 1 tablet by mouth daily.  . quinapril (ACCUPRIL) 20 MG tablet TAKE 1 TABLET DAILY  . EPINEPHrine 0.3 mg/0.3 mL IJ SOAJ injection Inject 0.3 mLs (0.3 mg total) into the muscle as needed (for anaphylactic / allergic reaction). (Patient not taking: Reported on 06/02/2016)  . valACYclovir (VALTREX) 1000 MG tablet Take 1 tablet (1,000 mg total) by mouth daily as needed. (Patient not taking: Reported on 06/02/2016)   No facility-administered encounter medications on file as of 06/02/2016.      Review of Systems  Constitutional: Negative.   HENT: Negative.   Eyes: Negative.   Respiratory: Negative.   Cardiovascular: Negative.   Gastrointestinal: Negative.   Endocrine: Negative.   Genitourinary: Negative.     Musculoskeletal: Negative.   Skin: Negative.   Allergic/Immunologic: Negative.   Neurological: Negative.   Hematological: Negative.   Psychiatric/Behavioral: Negative.        Objective:   Physical Exam  Constitutional: She is oriented to person, place, and time. She appears well-developed and well-nourished.  HENT:  Head: Normocephalic and atraumatic.  Right Ear: External ear normal.  Left Ear: External ear normal.  Nose: Nose normal.  Mouth/Throat: Oropharynx is clear and moist.  Eyes: Conjunctivae and EOM are normal. Pupils are equal, round, and reactive to light. Right eye exhibits no discharge. Left eye exhibits no discharge. No scleral icterus.  Neck: Normal range of motion. Neck supple. No thyromegaly present.  Cardiovascular: Normal rate, regular rhythm, normal heart sounds and intact distal pulses.   No murmur heard. Pulmonary/Chest: Effort normal and breath sounds normal. No respiratory distress. She has no wheezes. She has no rales.  Clear anteriorly and posteriorly  Abdominal: Soft. Bowel sounds are normal. She exhibits no mass. There is no tenderness. There is no rebound and no guarding.  No abdominal tenderness masses or bruits  Musculoskeletal: Normal range of motion. She exhibits no edema.  Lymphadenopathy:    She has no cervical adenopathy.  Neurological: She is alert and oriented to person, place, and time. She has normal reflexes. No cranial nerve deficit.  Skin: Skin is warm and dry. No rash noted.  Psychiatric: She has a normal mood and affect. Her behavior is normal. Judgment and thought content normal.  Nursing note  and vitals reviewed.  BP (!) 151/89 (BP Location: Left Arm)   Pulse 78   Temp 97.5 F (36.4 C) (Oral)   Ht 5\' 4"  (1.626 m)   Wt 136 lb (61.7 kg)   BMI 23.34 kg/m         Assessment & Plan:  1. Essential hypertension -The initial blood pressure was elevated. The repeat blood pressure in the office was 120/86. The patient will  continue to work with her diet and with weight loss and sodium restriction.  2. Vitamin D deficiency -Continue current treatment  3. Pure hypercholesterolemia -Continue atorvastatin and her current mode of taking this as her cholesterol numbers were very good.  Patient Instructions  Continue current medications. Continue good therapeutic lifestyle changes which include good diet and exercise. Fall precautions discussed with patient. If an FOBT was given today- please return it to our front desk. If you are over 63 years old - you may need Prevnar 13 or the adult Pneumonia vaccine.  **Flu shots are available--- please call and schedule a FLU-CLINIC appointment**  After your visit with us today you will receive a survey in the mail or online from [employer] regarding your care with us. Please take a moment to fill this out. Your feedback is very important to us as you can help us better understand your patient needs as well as improve your experience and satisfaction. WE CARE ABOUT YOU!!!   Monitor blood pressures more closely at home and bring readings to the next visit Continue with weight loss efforts and healthy eating habits  Nyra Capeson W. Elisheva Fallas MD

## 2016-06-02 NOTE — Patient Instructions (Addendum)
Continue current medications. Continue good therapeutic lifestyle changes which include good diet and exercise. Fall precautions discussed with patient. If an FOBT was given today- please return it to our front desk. If you are over 63 years old - you may need Prevnar 13 or the adult Pneumonia vaccine.  **Flu shots are available--- please call and schedule a FLU-CLINIC appointment**  After your visit with us today you will receive a survey in the mail or online from [employer] regarding your care with us. Please take a moment to fill this out. Your feedback is very important to us as you can help us better understand your patient needs as well as improve your experience and satisfaction. WE CARE ABOUT YOU!!!   Monitor blood pressures more closely at home and bring readings to the next visit Continue with weight loss efforts and healthy eating habits

## 2016-11-11 ENCOUNTER — Other Ambulatory Visit: Payer: Self-pay | Admitting: Family Medicine

## 2016-11-23 ENCOUNTER — Other Ambulatory Visit: Payer: BC Managed Care – PPO

## 2016-11-23 DIAGNOSIS — I10 Essential (primary) hypertension: Secondary | ICD-10-CM

## 2016-11-23 DIAGNOSIS — E7800 Pure hypercholesterolemia, unspecified: Secondary | ICD-10-CM

## 2016-11-23 DIAGNOSIS — E559 Vitamin D deficiency, unspecified: Secondary | ICD-10-CM

## 2016-11-23 DIAGNOSIS — M8580 Other specified disorders of bone density and structure, unspecified site: Secondary | ICD-10-CM

## 2016-11-24 LAB — BMP8+EGFR
BUN/Creatinine Ratio: 21 (ref 12–28)
BUN: 15 mg/dL (ref 8–27)
CALCIUM: 9.4 mg/dL (ref 8.7–10.3)
CHLORIDE: 105 mmol/L (ref 96–106)
CO2: 24 mmol/L (ref 18–29)
Creatinine, Ser: 0.71 mg/dL (ref 0.57–1.00)
GFR calc Af Amer: 104 mL/min/{1.73_m2} (ref >59)
GFR calc non Af Amer: 90 mL/min/{1.73_m2} (ref >59)
GLUCOSE: 87 mg/dL (ref 65–99)
Potassium: 3.9 mmol/L (ref 3.5–5.2)
Sodium: 143 mmol/L (ref 134–144)

## 2016-11-24 LAB — LIPID PANEL
CHOL/HDL RATIO: 3.3 ratio (ref 0.0–4.4)
CHOLESTEROL TOTAL: 151 mg/dL (ref 100–199)
HDL: 46 mg/dL (ref >39)
LDL Calculated: 87 mg/dL (ref 0–99)
TRIGLYCERIDES: 91 mg/dL (ref 0–149)
VLDL Cholesterol Cal: 18 mg/dL (ref 5–40)

## 2016-11-24 LAB — CBC WITH DIFFERENTIAL/PLATELET
BASOS: 1 %
Basophils Absolute: 0 10*3/uL (ref 0.0–0.2)
EOS (ABSOLUTE): 0.2 10*3/uL (ref 0.0–0.4)
EOS: 2 %
Hematocrit: 37.9 % (ref 34.0–46.6)
Hemoglobin: 12.8 g/dL (ref 11.1–15.9)
IMMATURE GRANS (ABS): 0 10*3/uL (ref 0.0–0.1)
IMMATURE GRANULOCYTES: 0 %
LYMPHS: 32 %
Lymphocytes Absolute: 2.1 10*3/uL (ref 0.7–3.1)
MCH: 29.7 pg (ref 26.6–33.0)
MCHC: 33.8 g/dL (ref 31.5–35.7)
MCV: 88 fL (ref 79–97)
Monocytes Absolute: 0.3 10*3/uL (ref 0.1–0.9)
Monocytes: 5 %
NEUTROS PCT: 60 %
Neutrophils Absolute: 3.8 10*3/uL (ref 1.4–7.0)
PLATELETS: 287 10*3/uL (ref 150–379)
RBC: 4.31 x10E6/uL (ref 3.77–5.28)
RDW: 14.4 % (ref 12.3–15.4)
WBC: 6.4 10*3/uL (ref 3.4–10.8)

## 2016-11-24 LAB — VITAMIN D 25 HYDROXY (VIT D DEFICIENCY, FRACTURES): Vit D, 25-Hydroxy: 33.4 ng/mL (ref 30.0–100.0)

## 2016-11-30 ENCOUNTER — Ambulatory Visit (INDEPENDENT_AMBULATORY_CARE_PROVIDER_SITE_OTHER): Payer: BC Managed Care – PPO | Admitting: Family Medicine

## 2016-11-30 ENCOUNTER — Ambulatory Visit (INDEPENDENT_AMBULATORY_CARE_PROVIDER_SITE_OTHER): Payer: BC Managed Care – PPO

## 2016-11-30 ENCOUNTER — Encounter: Payer: Self-pay | Admitting: Family Medicine

## 2016-11-30 VITALS — BP 158/86 | HR 81 | Temp 98.5°F | Ht 64.0 in | Wt 143.0 lb

## 2016-11-30 DIAGNOSIS — I10 Essential (primary) hypertension: Secondary | ICD-10-CM | POA: Diagnosis not present

## 2016-11-30 DIAGNOSIS — E559 Vitamin D deficiency, unspecified: Secondary | ICD-10-CM | POA: Diagnosis not present

## 2016-11-30 DIAGNOSIS — R413 Other amnesia: Secondary | ICD-10-CM

## 2016-11-30 DIAGNOSIS — M810 Age-related osteoporosis without current pathological fracture: Secondary | ICD-10-CM

## 2016-11-30 DIAGNOSIS — Z13820 Encounter for screening for osteoporosis: Secondary | ICD-10-CM

## 2016-11-30 DIAGNOSIS — R42 Dizziness and giddiness: Secondary | ICD-10-CM

## 2016-11-30 DIAGNOSIS — E7800 Pure hypercholesterolemia, unspecified: Secondary | ICD-10-CM | POA: Diagnosis not present

## 2016-11-30 MED ORDER — VALACYCLOVIR HCL 1 G PO TABS: 1000.0000 mg | ORAL_TABLET | Freq: Every day | ORAL | 3 refills | Status: DC | PRN

## 2016-11-30 MED ORDER — EPINEPHRINE 0.3 MG/0.3ML IJ SOAJ: 0.3000 mg | INTRAMUSCULAR | 1 refills | Status: DC | PRN

## 2016-11-30 MED ORDER — HYDROCHLOROTHIAZIDE 12.5 MG PO TABS: 12.5000 mg | ORAL_TABLET | Freq: Every day | ORAL | 3 refills | Status: DC

## 2016-11-30 NOTE — Patient Instructions (Addendum)
Continue current medications. Continue good therapeutic lifestyle changes which include good diet and exercise. Fall precautions discussed with patient. If an FOBT was given today- please return it to our front desk. If you are over 64 years old - you may need Prevnar 13 or the adult P59neumonia vaccine.  **Flu shots are available--- please call and schedule a FLU-CLINIC appointment**  After your visit with us today you will receive a survey in the mail or online from [employer] regarding your care with us. Please take a moment to fill this out. Your feedback is very important to us as you can help us better understand your patient needs as well as improve your experience and satisfaction. WE CARE ABOUT YOU!!!   Take additional blood pressure medication 1 daily every morning. This is a fluid pill. It is a low-dose fluid pill. The patient will need to come by and have her blood pressure rechecked by the neuro checks in the next 10-14 days. She will need a BMP at that time. We will arrange for her to see a neurologist about this episode that occurred around the time of her mother's death. If she does not hear from us within a certain period of time the next couple weeks she will call us back about this. In the meantime she should drink plenty of water and stay well hydrated and watch her sodium intake closely Use Flonase more regularly 1-2 sprays each nostril at bedtime Can take Benadryl as needed Avoid NSAIDs

## 2016-11-30 NOTE — Progress Notes (Signed)
Subjective:    Patient ID: Candice Beck, female    DOB: Feb 22, 1951, 66 y.o.   MRN: [id number]  HPI Pt here for follow up and management of chronic medical problems which includes hyperlipidemia and hypertension. She is taking medication regularly.The patient recently lost her mother. She is concerned about her memory. She also is concerned about some in her ear problems and dizziness. She is requesting a refill on Valtrex and EpiPen. She is going be scheduled for pelvic exam and Pap smear with Gennette PacMary Margaret. She will get a DEXA scan today and will be given an FOBT to return. She is due a colonoscopy soon. She has had lab work done and we will review this with the patient during the visit today. All of her cholesterol numbers were good with an LDL C being 87. Blood sugar renal function and electrolytes were normal. Vitamin D level was at the low end of the normal range at 33.4. We will to make sure that she is taking her vitamin D regularly. The CBC was within normal limits. She will be given a copy of the lab work. The patient just lost her mother at 66 years of age. She died from complications of a blood clot diverticulitis and sepsis. The patient was very anxious and upset. She had an episode around May 11 where she went for about 12 hours and does not remember anything that happened during that period time and this was right before her mother died and went to hospice. Since that day she is still remains somewhat hazy are foggy in her mind. While talking her she recall many of the details without problems of her mother's illness and demise. I do not pick up anything with memory issues currently. She is obviously concerned about this and doesn't understand what has happened. She has had some in her ear type sensations with dizziness. She denies any chest pain shortness of breath GI symptoms like nausea vomiting diarrhea or blood in the stool and has no trouble passing her water. She is better with the  dizziness. A repeat of the blood pressure was 158/86 in the right arm sitting.    Patient Active Problem List   Diagnosis Date Noted  . Osteopenia 03/07/2014  . Hypertension 04/01/2013  . Hyperlipidemia 04/01/2013  . Vitamin D deficiency 04/01/2013   Outpatient Encounter Prescriptions as of 11/30/2016  Medication Sig  . atorvastatin (LIPITOR) 40 MG tablet TAKE 1 TABLET DAILY  . cholecalciferol (VITAMIN D) 1000 UNITS tablet Take 5,000 Units by mouth daily. 3 times weekly  . Coenzyme Q10 (CO Q 10 PO) Take 1 tablet by mouth daily.  . quinapril (ACCUPRIL) 20 MG tablet TAKE 1 TABLET DAILY  . valACYclovir (VALTREX) 1000 MG tablet Take 1 tablet (1,000 mg total) by mouth daily as needed.  Marland Kitchen. EPINEPHrine 0.3 mg/0.3 mL IJ SOAJ injection Inject 0.3 mLs (0.3 mg total) into the muscle as needed (for anaphylactic / allergic reaction). (Patient not taking: Reported on 11/30/2016)   No facility-administered encounter medications on file as of 11/30/2016.      Review of Systems  Constitutional: Negative.   HENT: Negative.        Inner ear   Eyes: Negative.   Respiratory: Negative.   Cardiovascular: Negative.   Gastrointestinal: Negative.   Endocrine: Negative.   Genitourinary: Negative.   Musculoskeletal: Negative.   Skin: Negative.   Allergic/Immunologic: Negative.   Neurological: Positive for dizziness.       Episode on 10/28/16 -  memory loss - she lost her mom that day  Hematological: Negative.   Psychiatric/Behavioral: Negative.        Objective:   Physical Exam  Constitutional: She is oriented to person, place, and time. She appears well-developed and well-nourished. No distress.  The patient is pleasant and alert and sad because the loss of her mother.  HENT:  Head: Normocephalic and atraumatic.  Right Ear: External ear normal.  Left Ear: External ear normal.  Mouth/Throat: Oropharynx is clear and moist. No oropharyngeal exudate.  Nasal congestion turbinate swelling bilaterally    Eyes: Conjunctivae and EOM are normal. Pupils are equal, round, and reactive to light. Right eye exhibits no discharge. Left eye exhibits no discharge. No scleral icterus.  Neck: Normal range of motion. Neck supple. No thyromegaly present.  No bruits thyromegaly or anterior cervical adenopathy  Cardiovascular: Normal rate, regular rhythm, normal heart sounds and intact distal pulses.   No murmur heard. A recheck of the blood pressure by me was 154/89 with the patient sitting in the right arm with a regular cuff Heart had a regular rate and rhythm at 72/m  Pulmonary/Chest: Effort normal and breath sounds normal. No respiratory distress. She has no wheezes. She has no rales.  Clear anteriorly and posteriorly  Abdominal: Soft. Bowel sounds are normal. She exhibits no mass. There is no tenderness. There is no rebound and no guarding.  No abdominal tenderness masses or bruits  Musculoskeletal: Normal range of motion. She exhibits no edema.  Lymphadenopathy:    She has no cervical adenopathy.  Neurological: She is alert and oriented to person, place, and time. She has normal reflexes. No cranial nerve deficit.  Skin: Skin is warm and dry. No rash noted.  Psychiatric: She has a normal mood and affect. Her behavior is normal. Judgment and thought content normal.  Nursing note and vitals reviewed.  BP (!) 154/89   Pulse 81   Temp 98.5 F (36.9 C) (Oral)   Ht 5\' 4"  (1.626 m)   Wt 143 lb (64.9 kg)   BMI 24.55 kg/m         Assessment & Plan:  1. Essential hypertension -The blood pressure is elevated today. The patient has been through a lot of stress. She is only taking Accupril 20 mg once daily. We will add HCTZ 12.5 one daily to the current Accupril medication.  2. Vitamin D deficiency -The patient is not been taking her recommended vitamin D dose and we'll increase this dose back to her usual dose. - DG [facility] DEXA; Future  3. Pure hypercholesterolemia -Cholesterol numbers with  traditional lipid testing were good and she will continue with aggressive therapeutic lifestyle changes and current treatment  4. Screening for osteoporosis - DG [facility] DEXA; Future  5. Osteoporosis, unspecified osteoporosis type, unspecified pathological fracture presence - DG [facility] DEXA; Future  6. Amnesia - Ambulatory referral to Neurology  7. Dizziness -This is better. The patient has had this present before. She thinks it is related to her allergies. - Ambulatory referral to Neurology  Meds ordered this encounter  Medications  . EPINEPHrine 0.3 mg/0.3 mL IJ SOAJ injection    Sig: Inject 0.3 mLs (0.3 mg total) into the muscle as needed (for anaphylactic / allergic reaction).    Dispense:  1 Device    Refill:  1  . valACYclovir (VALTREX) 1000 MG tablet    Sig: Take 1 tablet (1,000 mg total) by mouth daily as needed.    Dispense:  30 tablet  Refill:  3  . hydrochlorothiazide (HYDRODIURIL) 12.5 MG tablet    Sig: Take 1 tablet (12.5 mg total) by mouth daily.    Dispense:  90 tablet    Refill:  3   Patient Instructions  Continue current medications. Continue good therapeutic lifestyle changes which include good diet and exercise. Fall precautions discussed with patient. If an FOBT was given today- please return it to our front desk. If you are over 30 years old - you may need Prevnar 13 or the adult Pneumonia vaccine.  **Flu shots are available--- please call and schedule a FLU-CLINIC appointment**  After your visit with [place] today you will receive a survey in the mail or online from [employer] regarding your care with [place]. Please take a moment to fill this out. Your feedback is very important to [place] as you can help [place] better understand your patient needs as well as improve your experience and satisfaction. WE CARE ABOUT YOU!!!   Take additional blood pressure medication 1 daily every morning. This is a fluid pill. It is a low-dose fluid pill. The patient will need to come by and  have her blood pressure rechecked by the neuro checks in the next 10-14 days. She will need a BMP at that time. We will arrange for her to see a neurologist about this episode that occurred around the time of her mother's death. If she does not hear from [place] within a certain period of time the next couple weeks she will call us back about this. In the meantime she should drink plenty of water and stay well hydrated and watch her sodium intake closely Use Flonase more regularly 1-2 sprays each nostril at bedtime Can take Benadryl as needed Avoid NSAIDs  Nyra Capes MD

## 2016-12-15 ENCOUNTER — Ambulatory Visit (INDEPENDENT_AMBULATORY_CARE_PROVIDER_SITE_OTHER): Payer: BC Managed Care – PPO | Admitting: *Deleted

## 2016-12-15 DIAGNOSIS — I10 Essential (primary) hypertension: Secondary | ICD-10-CM

## 2016-12-15 LAB — BMP8+EGFR
BUN/Creatinine Ratio: 13 (ref 12–28)
BUN: 10 mg/dL (ref 8–27)
CO2: 25 mmol/L (ref 20–29)
CREATININE: 0.8 mg/dL (ref 0.57–1.00)
Calcium: 9.7 mg/dL (ref 8.7–10.3)
Chloride: 103 mmol/L (ref 96–106)
GFR calc Af Amer: 90 mL/min/{1.73_m2} (ref >59)
GFR calc non Af Amer: 78 mL/min/{1.73_m2} (ref >59)
GLUCOSE: 94 mg/dL (ref 65–99)
Potassium: 3.6 mmol/L (ref 3.5–5.2)
SODIUM: 145 mmol/L — AB (ref 134–144)

## 2016-12-15 NOTE — Progress Notes (Signed)
Pt here for BP check BP 126/72 P 81  On pt's machine    BP   129/72 P 77

## 2017-01-31 ENCOUNTER — Ambulatory Visit (INDEPENDENT_AMBULATORY_CARE_PROVIDER_SITE_OTHER): Payer: BC Managed Care – PPO | Admitting: Diagnostic Neuroimaging

## 2017-01-31 ENCOUNTER — Encounter (INDEPENDENT_AMBULATORY_CARE_PROVIDER_SITE_OTHER): Payer: Self-pay

## 2017-01-31 ENCOUNTER — Encounter: Payer: Self-pay | Admitting: Diagnostic Neuroimaging

## 2017-01-31 VITALS — Resp 16 | Ht 64.0 in | Wt 141.8 lb

## 2017-01-31 DIAGNOSIS — G454 Transient global amnesia: Secondary | ICD-10-CM | POA: Diagnosis not present

## 2017-01-31 DIAGNOSIS — R42 Dizziness and giddiness: Secondary | ICD-10-CM | POA: Diagnosis not present

## 2017-01-31 DIAGNOSIS — H81391 Other peripheral vertigo, right ear: Secondary | ICD-10-CM | POA: Diagnosis not present

## 2017-01-31 NOTE — Patient Instructions (Signed)
Thank you for coming to see [place] at [facility]. I hope we have been able to provide you high quality care today.  You may receive a patient satisfaction survey over the next few weeks. We would appreciate your feedback and comments so that we may continue to improve ourselves and the health of our patients.   - MRI brain , MRA head  - TTE, carotid u/s  - start aspirin 102m daily    ~~~~~~~~~~~~~~~~~~~~~~~~~~~~~~~~~~~~~~~~~~~~~~~~~~~~~~~~~~~~~~~~~  DR. Grae Leathers'S GUIDE TO HAPPY AND HEALTHY LIVING These are some of my general health and wellness recommendations. Some of them may apply to you better than others. Please use common sense as you try these suggestions and feel free to ask me any questions.   ACTIVITY/FITNESS Mental, social, emotional and physical stimulation are very important for brain and body health. Try learning a new activity (arts, music, language, sports, games).  Keep moving your body to the best of your abilities. You can do this at home, inside or outside, the park, community center, gym or anywhere you like. Consider a physical therapist or personal trainer to get started. Consider the app Sworkit. Fitness trackers such as smart-watches, smart-phones or Fitbits can help as well.   NUTRITION Eat more plants: colorful vegetables, nuts, seeds and berries.  Eat less sugar, salt, preservatives and processed foods.  Avoid toxins such as cigarettes and alcohol.  Drink water when you are thirsty. Warm water with a slice of lemon is an excellent morning drink to start the day.  Consider these websites for more information The [employer] (hhttps://www.henry-hernandez.biz/ [employer] (wWindowBlog.ch   RELAXATION Consider practicing mindfulness meditation or other relaxation techniques such as deep breathing, prayer, yoga, tai chi, massage. See website mindful.org or the apps Headspace or  Calm to help get started.   SLEEP Try to get at least 7-8+ hours sleep per day. Regular exercise and reduced caffeine will help you sleep better. Practice good sleep hygeine techniques. See website sleep.org for more information.   PLANNING Prepare estate planning, living will, healthcare POA documents. Sometimes this is best planned with the help of an attorney. Theconversationproject.org and agingwithdignity.org are excellent resources.

## 2017-01-31 NOTE — Progress Notes (Signed)
[facility]  PATIENT: Candice Beck DOB: 1951/05/16  REFERRING CLINICIAN: Gari Crown, MD HISTORY FROM: patient and husband  REASON FOR VISIT: new consult    HISTORICAL  CHIEF COMPLAINT:  Chief Complaint  Patient presents with  . Memory Loss    Pt. reports difficulty with memory, mental clarity, some dizziness, since her mother passed away 2016/11/15.  Feels sx. are related to grieving process./fim  . Dizziness    HISTORY OF PRESENT ILLNESS:   66 year old female with history of migraine, here for evaluation of transient memory lapse.  11-15-16 patient's mother who had been in hospice for several weeks, passed away. Patient was called by hospice and informed of this news. Following this patient had significant memory lapse during that day. She was repeating herself throughout the day. She was having difficulty remembering what happened during that day. Symptoms continued throughout the day and improved by the next day. However she continued to have foggy hazy sensation in her mind for several days and weeks afterwards. Within a few weeks symptoms had fully resolved.  No associated numbness, weakness, slurred speech, vision changes or gait difficulty.  Symptoms have fully resolved. Patient feels a symptomatic today.  Patient was looking online and thinks she may have had "transient global amnesia".  No prior similar events.  In addition patient has had some intermittent dizzy spells which she feels is related to her sinuses. Sometimes when she turns her head to the right side she feels a spinning sensation. No associated nausea or vomiting or ringing in the ears.    REVIEW OF SYSTEMS: Full 14 system review of systems performed and negative with exception of: Only as per history of present illness.    ALLERGIES: No Known Allergies  HOME MEDICATIONS: Outpatient Medications Prior to Visit  Medication Sig Dispense Refill  . atorvastatin (LIPITOR) 40 MG tablet  TAKE 1 TABLET DAILY 90 tablet 0  . cholecalciferol (VITAMIN D) 1000 UNITS tablet Take 5,000 Units by mouth daily. 3 times weekly    . Coenzyme Q10 (CO Q 10 PO) Take 1 tablet by mouth daily.    Marland Kitchen EPINEPHrine 0.3 mg/0.3 mL IJ SOAJ injection Inject 0.3 mLs (0.3 mg total) into the muscle as needed (for anaphylactic / allergic reaction). 1 Device 1  . hydrochlorothiazide (HYDRODIURIL) 12.5 MG tablet Take 1 tablet (12.5 mg total) by mouth daily. 90 tablet 3  . quinapril (ACCUPRIL) 20 MG tablet TAKE 1 TABLET DAILY 90 tablet 1  . valACYclovir (VALTREX) 1000 MG tablet Take 1 tablet (1,000 mg total) by mouth daily as needed. 30 tablet 3   No facility-administered medications prior to visit.     PAST MEDICAL HISTORY: Past Medical History:  Diagnosis Date  . Hyperlipidemia   . Hypertension   . Vitamin D deficiency     PAST SURGICAL HISTORY: Past Surgical History:  Procedure Laterality Date  . TONSILLECTOMY      FAMILY HISTORY: Family History  Problem Relation Age of Onset  . Cancer Mother        breast   . Heart disease Mother        aortic valve replaced x2  . Osteoporosis Mother   . Cancer Father        throat  . Heart disease Brother        heart attack age 94  . Diabetes Brother        due to weight  . Hypertension Brother   . Hypertension Brother   . Cancer  Maternal Aunt        breast      SOCIAL HISTORY:  Social History   Social History  . Marital status: Married    Spouse name: N/A  . Number of children: 2  . Years of education: 14   Occupational History  . Not on file.   Social History Main Topics  . Smoking status: Never Smoker  . Smokeless tobacco: Never Used  . Alcohol use Yes  . Drug use: No  . Sexual activity: Yes   Other Topics Concern  . Not on file   Social History Narrative  . No narrative on file     PHYSICAL EXAM  GENERAL EXAM/CONSTITUTIONAL: Vitals:  Vitals:   01/31/17 0828  Resp: 16  Weight: 141 lb 12.8 oz (64.3 kg)  Height: 5'  4" (1.626 m)   Body mass index is 24.34 kg/m.  Visual Acuity Screening   Right eye Left eye Both eyes  Without correction: 20/30 20/20   With correction:      Orthostatic VS for the past 24 hrs (Last 3 readings):  BP- Lying Pulse- Lying BP- Sitting Pulse- Sitting BP- Standing at 0 minutes Pulse- Standing at 0 minutes BP- Standing at 3 minutes Pulse- Standing at 3 minutes  01/31/17 0850 145/78 71 131/80 79 127/77 76 149/83 79      Patient is in no distress; well developed, nourished and groomed; neck is supple  CARDIOVASCULAR:  Examination of carotid arteries is normal; no carotid bruits  Regular rate and rhythm, no murmurs  Examination of peripheral vascular system by observation and palpation is normal  EYES:  Ophthalmoscopic exam of optic discs and posterior segments is normal; no papilledema or hemorrhages  MUSCULOSKELETAL:  Gait, strength, tone, movements noted in Neurologic exam below  NEUROLOGIC: MENTAL STATUS:  MMSE - Mini Mental State Exam 01/31/2017  Orientation to time 5  Orientation to Place 5  Registration 3  Attention/ Calculation 5  Recall 3  Language- name 2 objects 2  Language- repeat 1  Language- follow 3 step command 3  Language- read & follow direction 1  Write a sentence 1  Copy design 1  Total score 30    awake, alert, oriented to person, place and time  recent and remote memory intact  normal attention and concentration  language fluent, comprehension intact, naming intact,   fund of knowledge appropriate  AFT 14  CRANIAL NERVE:   2nd - no papilledema on fundoscopic exam  2nd, 3rd, 4th, 6th - pupils equal and reactive to light, visual fields full to confrontation, extraocular muscles intact, no nystagmus  5th - facial sensation symmetric  7th - facial strength symmetric  8th - hearing intact  9th - palate elevates symmetrically, uvula midline  11th - shoulder shrug symmetric  12th - tongue protrusion midline  MOTOR:    normal bulk and tone, full strength in the BUE, BLE  SENSORY:   normal and symmetric to light touch, temperature, vibration  COORDINATION:   finger-nose-finger, fine finger movements normal  REFLEXES:   deep tendon reflexes present and symmetric  GAIT/STATION:   narrow based gait; romberg is negative    DIAGNOSTIC DATA (LABS, IMAGING, TESTING) - I reviewed patient records, labs, notes, testing and imaging myself where available.  Lab Results  Component Value Date   WBC 6.4 11/23/2016   HGB 12.8 11/23/2016   HCT 37.9 11/23/2016   MCV 88 11/23/2016   PLT 287 11/23/2016      Component Value  Date/Time   NA 145 (H) 12/15/2016 0916   K 3.6 12/15/2016 0916   CL 103 12/15/2016 0916   CO2 25 12/15/2016 0916   GLUCOSE 94 12/15/2016 0916   GLUCOSE 83 11/08/2012 0814   BUN 10 12/15/2016 0916   CREATININE 0.80 12/15/2016 0916   CREATININE 0.71 11/08/2012 0814   CALCIUM 9.7 12/15/2016 0916   PROT 6.6 05/31/2016 1524   ALBUMIN 4.4 05/31/2016 1524   AST 14 05/31/2016 1524   ALT 14 05/31/2016 1524   ALKPHOS 91 05/31/2016 1524   BILITOT 0.4 05/31/2016 1524   GFRNONAA 78 12/15/2016 0916   GFRNONAA >89 11/08/2012 0814   GFRAA 90 12/15/2016 0916   GFRAA >89 11/08/2012 0814   Lab Results  Component Value Date   CHOL 151 11/23/2016   HDL 46 11/23/2016   LDLCALC 87 11/23/2016   TRIG 91 11/23/2016   CHOLHDL 3.3 11/23/2016   No results found for: HGBA1C No results found for: VITAMINB12 No results found for: TSH      ASSESSMENT AND PLAN  66 y.o. year old female here with transient memory lapse, confusion, on the day her mother passed away in hospice on 10/28/16. Most likely represents a type of stress reaction. Transient global amnesia or TIA or felt to be less likely. However will complete stroke/TIA workup. Advised on vascular risk factor reduction.  Also with mild intermittent positional vertigo. Most likely represents peripheral vestibulopathy. Normal neurologic  exam today. Will follow up with TIA/stroke workup as well.    Ddx: TGA vs TIA (less likely)  1. TGA (transient global amnesia)   2. Transient global amnesia   3. Dizziness   4. Peripheral positional vertigo of right ear      PLAN:  TGA / TIA workup - check MRI brain , MRA head - check TTE, carotid u/s - start aspirin 81mg  daily - continue BP and lipid control - will call patient with test results; follow up as needed pending results  Orders Placed This Encounter  Procedures  . MR BRAIN WO CONTRAST  . MR MRA HEAD WO CONTRAST  . ECHOCARDIOGRAM COMPLETE   Return as needed if symptoms worsen or fail to improve; pending test  results.    Suanne MarkerVIKRAM R. PENUMALLI, MD 01/31/2017, 9:18 AM Certified in Neurology, Neurophysiology and Neuroimaging  [facility] [address], [address] [place], [place] [address] [phone]

## 2017-02-15 ENCOUNTER — Other Ambulatory Visit: Payer: BC Managed Care – PPO

## 2017-02-22 ENCOUNTER — Ambulatory Visit (INDEPENDENT_AMBULATORY_CARE_PROVIDER_SITE_OTHER): Payer: BC Managed Care – PPO

## 2017-02-22 DIAGNOSIS — G454 Transient global amnesia: Secondary | ICD-10-CM

## 2017-03-02 ENCOUNTER — Ambulatory Visit (HOSPITAL_COMMUNITY): Payer: BC Managed Care – PPO | Attending: Cardiovascular Disease

## 2017-03-02 ENCOUNTER — Other Ambulatory Visit: Payer: Self-pay

## 2017-03-02 ENCOUNTER — Telehealth: Payer: Self-pay | Admitting: *Deleted

## 2017-03-02 DIAGNOSIS — R42 Dizziness and giddiness: Secondary | ICD-10-CM | POA: Insufficient documentation

## 2017-03-02 DIAGNOSIS — Z8249 Family history of ischemic heart disease and other diseases of the circulatory system: Secondary | ICD-10-CM | POA: Diagnosis not present

## 2017-03-02 DIAGNOSIS — G454 Transient global amnesia: Secondary | ICD-10-CM | POA: Insufficient documentation

## 2017-03-02 DIAGNOSIS — E785 Hyperlipidemia, unspecified: Secondary | ICD-10-CM | POA: Diagnosis not present

## 2017-03-02 DIAGNOSIS — I10 Essential (primary) hypertension: Secondary | ICD-10-CM | POA: Insufficient documentation

## 2017-03-02 NOTE — Telephone Encounter (Signed)
-----   Message from Suanne MarkerVikram R Penumalli, MD sent at 03/01/2017  9:04 PM EDT ----- Mild atherosclerosis in 1 vessel (not likely related to symptoms). No other major findings. Continue current plan. -VRP

## 2017-03-02 NOTE — Telephone Encounter (Signed)
Spoke to pt and relayed the results of both her MRI Brain and MRA Head per Dr. Marjory LiesPenumalli,  She verbalized understanding.

## 2017-03-22 ENCOUNTER — Telehealth: Payer: Self-pay | Admitting: *Deleted

## 2017-03-22 NOTE — Telephone Encounter (Signed)
LVM requesting call back for echocardiogram results. 

## 2017-03-23 NOTE — Telephone Encounter (Signed)
LVM on mobile # informing patient that her echocardiogram result was unremarkable with no major findings. Left number for any questions.

## 2017-05-25 ENCOUNTER — Other Ambulatory Visit: Payer: Self-pay | Admitting: Family Medicine

## 2017-05-25 NOTE — Telephone Encounter (Signed)
OV 06/01/17 

## 2017-05-26 ENCOUNTER — Other Ambulatory Visit: Payer: BC Managed Care – PPO

## 2017-05-26 DIAGNOSIS — E559 Vitamin D deficiency, unspecified: Secondary | ICD-10-CM

## 2017-05-26 DIAGNOSIS — E7800 Pure hypercholesterolemia, unspecified: Secondary | ICD-10-CM

## 2017-05-26 DIAGNOSIS — I10 Essential (primary) hypertension: Secondary | ICD-10-CM

## 2017-05-27 LAB — CBC WITH DIFFERENTIAL/PLATELET
BASOS: 0 %
Basophils Absolute: 0 10*3/uL (ref 0.0–0.2)
EOS (ABSOLUTE): 0.3 10*3/uL (ref 0.0–0.4)
EOS: 3 %
HEMATOCRIT: 39.1 % (ref 34.0–46.6)
Hemoglobin: 13.2 g/dL (ref 11.1–15.9)
Immature Grans (Abs): 0 10*3/uL (ref 0.0–0.1)
Immature Granulocytes: 0 %
LYMPHS ABS: 2.4 10*3/uL (ref 0.7–3.1)
Lymphs: 31 %
MCH: 29.3 pg (ref 26.6–33.0)
MCHC: 33.8 g/dL (ref 31.5–35.7)
MCV: 87 fL (ref 79–97)
MONOS ABS: 0.4 10*3/uL (ref 0.1–0.9)
Monocytes: 5 %
Neutrophils Absolute: 4.7 10*3/uL (ref 1.4–7.0)
Neutrophils: 61 %
Platelets: 313 10*3/uL (ref 150–379)
RBC: 4.5 x10E6/uL (ref 3.77–5.28)
RDW: 14.1 % (ref 12.3–15.4)
WBC: 7.8 10*3/uL (ref 3.4–10.8)

## 2017-05-27 LAB — BMP8+EGFR
BUN / CREAT RATIO: 14 (ref 12–28)
BUN: 10 mg/dL (ref 8–27)
CO2: 27 mmol/L (ref 20–29)
CREATININE: 0.74 mg/dL (ref 0.57–1.00)
Calcium: 9.7 mg/dL (ref 8.7–10.3)
Chloride: 104 mmol/L (ref 96–106)
GFR calc Af Amer: 99 mL/min/{1.73_m2} (ref >59)
GFR, EST NON AFRICAN AMERICAN: 86 mL/min/{1.73_m2} (ref >59)
Glucose: 86 mg/dL (ref 65–99)
Potassium: 3.8 mmol/L (ref 3.5–5.2)
SODIUM: 146 mmol/L — AB (ref 134–144)

## 2017-05-27 LAB — HEPATIC FUNCTION PANEL
ALBUMIN: 4.4 g/dL (ref 3.6–4.8)
ALK PHOS: 91 IU/L (ref 39–117)
ALT: 13 IU/L (ref 0–32)
AST: 16 IU/L (ref 0–40)
BILIRUBIN TOTAL: 0.5 mg/dL (ref 0.0–1.2)
BILIRUBIN, DIRECT: 0.12 mg/dL (ref 0.00–0.40)
TOTAL PROTEIN: 6.6 g/dL (ref 6.0–8.5)

## 2017-05-27 LAB — LIPID PANEL
CHOL/HDL RATIO: 4.1 ratio (ref 0.0–4.4)
Cholesterol, Total: 182 mg/dL (ref 100–199)
HDL: 44 mg/dL (ref >39)
LDL CALC: 98 mg/dL (ref 0–99)
Triglycerides: 199 mg/dL — ABNORMAL HIGH (ref 0–149)
VLDL CHOLESTEROL CAL: 40 mg/dL (ref 5–40)

## 2017-05-27 LAB — VITAMIN D 25 HYDROXY (VIT D DEFICIENCY, FRACTURES): Vit D, 25-Hydroxy: 41.7 ng/mL (ref 30.0–100.0)

## 2017-06-01 ENCOUNTER — Encounter (INDEPENDENT_AMBULATORY_CARE_PROVIDER_SITE_OTHER): Payer: Self-pay

## 2017-06-01 ENCOUNTER — Encounter: Payer: Self-pay | Admitting: Family Medicine

## 2017-06-01 ENCOUNTER — Ambulatory Visit: Payer: BC Managed Care – PPO | Admitting: Family Medicine

## 2017-06-01 VITALS — BP 134/72 | HR 82 | Temp 98.3°F | Ht 64.0 in | Wt 142.0 lb

## 2017-06-01 DIAGNOSIS — E7800 Pure hypercholesterolemia, unspecified: Secondary | ICD-10-CM

## 2017-06-01 DIAGNOSIS — R1310 Dysphagia, unspecified: Secondary | ICD-10-CM | POA: Diagnosis not present

## 2017-06-01 DIAGNOSIS — E559 Vitamin D deficiency, unspecified: Secondary | ICD-10-CM

## 2017-06-01 DIAGNOSIS — L209 Atopic dermatitis, unspecified: Secondary | ICD-10-CM | POA: Diagnosis not present

## 2017-06-01 DIAGNOSIS — I10 Essential (primary) hypertension: Secondary | ICD-10-CM

## 2017-06-01 DIAGNOSIS — R1319 Other dysphagia: Secondary | ICD-10-CM

## 2017-06-01 MED ORDER — MOMETASONE FUROATE 0.1 % EX CREA: 1.0000 "application " | TOPICAL_CREAM | Freq: Every day | CUTANEOUS | 3 refills | Status: DC

## 2017-06-01 MED ORDER — HYDROCHLOROTHIAZIDE 12.5 MG PO TABS: 12.5000 mg | ORAL_TABLET | Freq: Every day | ORAL | 3 refills | Status: DC

## 2017-06-01 NOTE — Patient Instructions (Addendum)
Medicare Annual Wellness Visit  [place] and the medical providers at [facility] strive to bring you the best medical care.  In doing so we not only want to address your current medical conditions and concerns but also to detect new conditions early and prevent illness, disease and health-related problems.    Medicare offers a yearly Wellness Visit which allows our clinical staff to assess your need for preventative services including immunizations, lifestyle education, counseling to decrease risk of preventable diseases and screening for fall risk and other medical concerns.    This visit is provided free of charge (no copay) for all Medicare recipients. The clinical pharmacists at [facility] have begun to conduct these Wellness Visits which will also include a thorough review of all your medications.    As you primary medical provider recommend that you make an appointment for your Annual Wellness Visit if you have not done so already this year.  You may set up this appointment before you leave today or you may call back [phone] and schedule an appointment.  Please make sure when you call that you mention that you are scheduling your Annual Wellness Visit with the clinical pharmacist so that the appointment may be made for the proper length of time.     Continue current medications. Continue good therapeutic lifestyle changes which include good diet and exercise. Fall precautions discussed with patient. If an FOBT was given today- please return it to our front desk. If you are over 64 years old - you may need Prevnar 13 or the adult Pneumonia vaccine.  **Flu shots are available--- please call and schedule a FLU-CLINIC appointment**  After your visit with us today you will receive a survey in the mail or online from [employer] regarding your care with us. Please take a moment to fill this out. Your feedback is very  important to us as you can help us better understand your patient needs as well as improve your experience and satisfaction. WE CARE ABOUT YOU!!!   Drink plenty of fluids Continue with atorvastatin and aggressive therapeutic lifestyle changes because of family history Take Zantac 150 mg or ranitidine, the equate brand at [facility] and take 1 twice daily before breakfast and supper for 4 weeks and then as needed after that. If the problem with swallowing continues, get back in touch with us and we will arrange for you to have an endoscopy with Dr. Madilyn FiremanHayes. Use scent free fabric softeners and shampoos Use soaps and detergents that are sent free and dermatology approved Use water-based makeup We will refill your Elocon and would recommend trying some Monistat cream at the corners of your mouth when that problem recurs in the future

## 2017-06-01 NOTE — Addendum Note (Signed)
Addended by: Magdalene RiverBULLINS, Jowana Thumma H on: 06/01/2017 09:37 AM   Modules accepted: Orders

## 2017-06-01 NOTE — Progress Notes (Signed)
Subjective:    Patient ID: Candice Beck, female    DOB: June 17, 1951, 66 y.o.   MRN: [id number]  HPI Pt here for follow up and management of chronic medical problems which includes hypertension. She is taking medication regularly.  The patient is doing well with no complaints.  She is requesting a refill on her hydrochlorothiazide.  There is a positive family history for heart disease especially and that her brother died at 3043 of an acute MI.  Her mother had breast cancer and her father had throat cancer.  Family history is also positive for hypertension.  The patient has had lab work done and this will be reviewed with her during the visit today.  Her LDL see cholesterol was good and at goal of less than 100 at 98.  The HDL remains good at 44.  Triglycerides were elevated at 199.  The blood sugar was good at 86 and the creatinine was within normal limits.  The serum sodium was slightly elevated and the remainder of the electrolytes are good.  The CBC had a normal white blood cell count with a good and stable hemoglobin at 13.2 and an adequate platelet count.  All liver function tests were normal and the vitamin D level is good at 41.7 I understand that a hepatitis C screen has been added to this blood work.  The patient is pleasant and doing well overall.  She denies any chest pain or shortness of breath.  She does have occasional indigestion and has trouble with swallowing and this does not happen often but does occur with certain foods.  This happened recently in November.  She does not have any heartburn.  She denies any change in bowel habits blood in the stool or black tarry bowel movements.  As a result of this discussion, we mentioned that she might want to go ahead and try some ranitidine or Zantac 150 mg over-the-counter and take 1 twice a day for 1 month on a regular basis and then as needed.  She was instructed to try this and if she continues to have trouble that she should get back in touch  with us so we could arrange for her to have an endoscopy for a possible esophageal stricture.  She has no trouble with passing her water.    Patient Active Problem List   Diagnosis Date Noted  . Osteopenia 03/07/2014  . Hypertension 04/01/2013  . Hyperlipidemia 04/01/2013  . Vitamin D deficiency 04/01/2013   Outpatient Encounter Medications as of 06/01/2017  Medication Sig  . atorvastatin (LIPITOR) 40 MG tablet TAKE 1 TABLET DAILY  . cholecalciferol (VITAMIN D) 1000 UNITS tablet Take 5,000 Units by mouth daily. 3 times weekly  . Coenzyme Q10 (CO Q 10 PO) Take 1 tablet by mouth daily.  Marland Kitchen. EPINEPHrine 0.3 mg/0.3 mL IJ SOAJ injection Inject 0.3 mLs (0.3 mg total) into the muscle as needed (for anaphylactic / allergic reaction).  . hydrochlorothiazide (HYDRODIURIL) 12.5 MG tablet Take 1 tablet (12.5 mg total) by mouth daily.  . quinapril (ACCUPRIL) 20 MG tablet TAKE 1 TABLET DAILY  . valACYclovir (VALTREX) 1000 MG tablet Take 1 tablet (1,000 mg total) by mouth daily as needed.   No facility-administered encounter medications on file as of 06/01/2017.       Review of Systems  Constitutional: Negative.   HENT: Negative.   Eyes: Negative.   Respiratory: Negative.   Cardiovascular: Negative.   Gastrointestinal: Negative.   Endocrine: Negative.   Genitourinary: Negative.  Musculoskeletal: Negative.   Skin: Negative.   Allergic/Immunologic: Negative.   Neurological: Negative.   Hematological: Negative.   Psychiatric/Behavioral: Negative.        Objective:   Physical Exam  Constitutional: She is oriented to person, place, and time. She appears well-developed and well-nourished. No distress.  The patient is pleasant and alert  HENT:  Head: Normocephalic and atraumatic.  Right Ear: External ear normal.  Left Ear: External ear normal.  Mouth/Throat: Oropharynx is clear and moist. No oropharyngeal exudate.  Slight nasal turbinate congestion bilaterally  Eyes: Conjunctivae and  EOM are normal. Pupils are equal, round, and reactive to light. Right eye exhibits no discharge. Left eye exhibits no discharge. No scleral icterus.  Neck: Normal range of motion. Neck supple. No thyromegaly present.  No bruits thyromegaly or anterior cervical adenopathy  Cardiovascular: Normal rate, regular rhythm, normal heart sounds and intact distal pulses.  No murmur heard. The heart is regular at 72/min with good pedal pulses  Pulmonary/Chest: Effort normal and breath sounds normal. No respiratory distress. She has no wheezes. She has no rales.  Clear anteriorly and posteriorly  Abdominal: Soft. Bowel sounds are normal. She exhibits no mass. There is no tenderness. There is no rebound and no guarding.  No abdominal tenderness masses bruits or organ enlargement  Musculoskeletal: Normal range of motion. She exhibits no edema.  Lymphadenopathy:    She has no cervical adenopathy.  Neurological: She is alert and oriented to person, place, and time. She has normal reflexes. No cranial nerve deficit.  Skin: Skin is warm and dry. Rash noted. There is erythema.  Dry skin and small areas of rash on chest wall noted.  Psychiatric: She has a normal mood and affect. Her behavior is normal. Judgment and thought content normal.  Nursing note and vitals reviewed.   BP 134/72 (BP Location: Left Arm)   Pulse 82   Temp 98.3 F (36.8 C) (Oral)   Ht 5\' 4"  (1.626 m)   Wt 142 lb (64.4 kg)   BMI 24.37 kg/m        Assessment & Plan:  1. Essential hypertension -Blood pressure is good today and she will continue with current treatment  2. Vitamin D deficiency -Increase vitamin D replacement to 2000 units daily  3. Pure hypercholesterolemia -Continue with atorvastatin and with as aggressive therapeutic lifestyle changes as possible  4. Esophageal dysphagia -Try ranitidine 150 mg twice daily for 1 month and then as needed.  Problems with swallowing continues she will get back in touch with [place] and  we will arrange for her to have an endoscopy  5. Atopic dermatitis, unspecified type -Refill Elocon and avoid soaps fabric softeners and detergents that are not scent free. -Drink plenty of fluids and stay well-hydrated  No orders of the defined types were placed in this encounter.  Patient Instructions                       Medicare Annual Wellness Visit  [place] and the medical providers at [facility] strive to bring you the best medical care.  In doing so we not only want to address your current medical conditions and concerns but also to detect new conditions early and prevent illness, disease and health-related problems.    Medicare offers a yearly Wellness Visit which allows our clinical staff to assess your need for preventative services including immunizations, lifestyle education, counseling to decrease risk of preventable diseases and screening for fall  risk and other medical concerns.    This visit is provided free of charge (no copay) for all Medicare recipients. The clinical pharmacists at [facility] have begun to conduct these Wellness Visits which will also include a thorough review of all your medications.    As you primary medical provider recommend that you make an appointment for your Annual Wellness Visit if you have not done so already this year.  You may set up this appointment before you leave today or you may call back [phone] and schedule an appointment.  Please make sure when you call that you mention that you are scheduling your Annual Wellness Visit with the clinical pharmacist so that the appointment may be made for the proper length of time.     Continue current medications. Continue good therapeutic lifestyle changes which include good diet and exercise. Fall precautions discussed with patient. If an FOBT was given today- please return it to our front desk. If you are over 483 years old - you may need Prevnar  13 or the adult Pneumonia vaccine.  **Flu shots are available--- please call and schedule a FLU-CLINIC appointment**  After your visit with us today you will receive a survey in the mail or online from [employer] regarding your care with us. Please take a moment to fill this out. Your feedback is very important to us as you can help us better understand your patient needs as well as improve your experience and satisfaction. WE CARE ABOUT YOU!!!   Drink plenty of fluids Continue with atorvastatin and aggressive therapeutic lifestyle changes because of family history Take Zantac 150 mg or ranitidine, the equate brand at [facility] and take 1 twice daily before breakfast and supper for 4 weeks and then as needed after that. If the problem with swallowing continues, get back in touch with us and we will arrange for you to have an endoscopy with Dr. Madilyn FiremanHayes. Use scent free fabric softeners and shampoos Use soaps and detergents that are sent free and dermatology approved Use water-based makeup We will refill your Elocon and would recommend trying some Monistat cream at the corners of your mouth when that problem recurs in the future     Nyra Capeson W. Moore MD

## 2017-06-05 LAB — SPECIMEN STATUS REPORT

## 2017-06-05 LAB — HEPATITIS C ANTIBODY: Hep C Virus Ab: <0.1 s/co ratio (ref 0.0–0.9)

## 2017-06-09 ENCOUNTER — Encounter: Payer: Self-pay | Admitting: *Deleted

## 2017-06-29 ENCOUNTER — Encounter: Payer: Self-pay | Admitting: Nurse Practitioner

## 2017-06-29 ENCOUNTER — Ambulatory Visit (INDEPENDENT_AMBULATORY_CARE_PROVIDER_SITE_OTHER): Payer: BC Managed Care – PPO | Admitting: Nurse Practitioner

## 2017-06-29 VITALS — BP 140/74 | HR 76 | Temp 97.0°F | Ht 64.0 in | Wt 145.0 lb

## 2017-06-29 DIAGNOSIS — Z01419 Encounter for gynecological examination (general) (routine) without abnormal findings: Secondary | ICD-10-CM

## 2017-06-29 NOTE — Progress Notes (Signed)
   Subjective:    Patient ID: Walker KehrJoyce R Cai, female    DOB: 1950/11/13, 67 y.o.   MRN: [id number]  HPI Patient comes in today for pap and pelvic exam. She is a regular patient of Dr. Christell ConstantMoore that ws seen for follow up 11/30/16. She is doing well today without complaints.    Review of Systems  Constitutional: Negative for activity change and appetite change.  HENT: Negative.   Eyes: Negative for pain.  Respiratory: Negative for shortness of breath.   Cardiovascular: Negative for chest pain, palpitations and leg swelling.  Gastrointestinal: Negative for abdominal pain.  Endocrine: Negative for polydipsia.  Genitourinary: Negative.   Skin: Negative for rash.  Neurological: Negative for dizziness, weakness and headaches.  Hematological: Does not bruise/bleed easily.  Psychiatric/Behavioral: Negative.   All other systems reviewed and are negative.      Objective:   Physical Exam  Constitutional: She is oriented to person, place, and time. She appears well-developed and well-nourished.  HENT:  Head: Normocephalic.  Right Ear: Hearing, tympanic membrane, external ear and ear canal normal.  Left Ear: Hearing, tympanic membrane, external ear and ear canal normal.  Nose: Nose normal.  Mouth/Throat: Uvula is midline and oropharynx is clear and moist.  Eyes: Conjunctivae and EOM are normal. Pupils are equal, round, and reactive to light.  Neck: Normal range of motion and full passive range of motion without pain. Neck supple. No JVD present. Carotid bruit is not present. No thyroid mass and no thyromegaly present.  Cardiovascular: Normal rate, normal heart sounds and intact distal pulses.  No murmur heard. Pulmonary/Chest: Effort normal and breath sounds normal. Right breast exhibits no inverted nipple, no mass, no nipple discharge, no skin change and no tenderness. Left breast exhibits no inverted nipple, no mass, no nipple discharge, no skin change and no tenderness.  Abdominal: Soft.  Bowel sounds are normal. She exhibits no mass. There is no tenderness.  Genitourinary: Vagina normal and uterus normal. No breast swelling, tenderness, discharge or bleeding. No vaginal discharge found.  Genitourinary Comments: bimanual exam-No adnexal masses or tenderness. Cervix parous and pink.  Musculoskeletal: Normal range of motion.  Lymphadenopathy:    She has no cervical adenopathy.  Neurological: She is alert and oriented to person, place, and time.  Skin: Skin is warm and dry.  Psychiatric: She has a normal mood and affect. Her behavior is normal. Judgment and thought content normal.   BP 140/74   Pulse 76   Temp (!) 97 F (36.1 C) (Oral)   Ht 5\' 4"  (1.626 m)   Wt 145 lb (65.8 kg)   BMI 24.89 kg/m         Assessment & Plan:   1. Gynecologic exam normal    No labs needed to day Keep followup appointment with Dr. Cristela FeltMOore   Mary-Margaret Lakrisha Iseman, FNP

## 2017-07-01 LAB — IGP, APTIMA HPV, RFX 16/18,45
HPV Aptima: NEGATIVE
PAP SMEAR COMMENT: 0

## 2017-09-26 ENCOUNTER — Telehealth: Payer: Self-pay | Admitting: Family Medicine

## 2017-09-26 MED ORDER — QUINAPRIL HCL 20 MG PO TABS: 20.0000 mg | ORAL_TABLET | Freq: Every day | ORAL | 0 refills | Status: DC

## 2017-09-26 MED ORDER — QUINAPRIL HCL 20 MG PO TABS: 20.0000 mg | ORAL_TABLET | Freq: Every day | ORAL | 2 refills | Status: DC

## 2017-09-26 MED ORDER — SCOPOLAMINE 1 MG/3DAYS TD PT72: 1.0000 | MEDICATED_PATCH | TRANSDERMAL | 0 refills | Status: DC

## 2017-09-26 NOTE — Telephone Encounter (Signed)
Refill for Quinapril sent to [facility] Pt wanting scopolamine, last Rxd 2015 Please advise

## 2017-09-26 NOTE — Telephone Encounter (Signed)
Pt aware meds at pharm 

## 2017-11-22 ENCOUNTER — Ambulatory Visit: Payer: BC Managed Care – PPO | Admitting: Neurology

## 2017-12-01 ENCOUNTER — Other Ambulatory Visit: Payer: Medicare Other

## 2017-12-01 DIAGNOSIS — E559 Vitamin D deficiency, unspecified: Secondary | ICD-10-CM

## 2017-12-01 DIAGNOSIS — Z0000 Encounter for general adult medical examination without abnormal findings: Secondary | ICD-10-CM

## 2017-12-01 DIAGNOSIS — E7800 Pure hypercholesterolemia, unspecified: Secondary | ICD-10-CM

## 2017-12-01 DIAGNOSIS — I10 Essential (primary) hypertension: Secondary | ICD-10-CM

## 2017-12-02 LAB — CBC WITH DIFFERENTIAL/PLATELET
BASOS: 1 %
Basophils Absolute: 0 10*3/uL (ref 0.0–0.2)
EOS (ABSOLUTE): 0.2 10*3/uL (ref 0.0–0.4)
Eos: 3 %
Hematocrit: 37.8 % (ref 34.0–46.6)
Hemoglobin: 12.6 g/dL (ref 11.1–15.9)
IMMATURE GRANS (ABS): 0 10*3/uL (ref 0.0–0.1)
IMMATURE GRANULOCYTES: 0 %
LYMPHS: 34 %
Lymphocytes Absolute: 1.9 10*3/uL (ref 0.7–3.1)
MCH: 28.8 pg (ref 26.6–33.0)
MCHC: 33.3 g/dL (ref 31.5–35.7)
MCV: 86 fL (ref 79–97)
Monocytes Absolute: 0.4 10*3/uL (ref 0.1–0.9)
Monocytes: 8 %
NEUTROS PCT: 54 %
Neutrophils Absolute: 3.1 10*3/uL (ref 1.4–7.0)
PLATELETS: 308 10*3/uL (ref 150–450)
RBC: 4.38 x10E6/uL (ref 3.77–5.28)
RDW: 14.4 % (ref 12.3–15.4)
WBC: 5.7 10*3/uL (ref 3.4–10.8)

## 2017-12-02 LAB — HEPATIC FUNCTION PANEL
ALT: 13 IU/L (ref 0–32)
AST: 14 IU/L (ref 0–40)
Albumin: 4.4 g/dL (ref 3.6–4.8)
Alkaline Phosphatase: 90 IU/L (ref 39–117)
BILIRUBIN, DIRECT: 0.12 mg/dL (ref 0.00–0.40)
Bilirubin Total: 0.5 mg/dL (ref 0.0–1.2)
Total Protein: 6.6 g/dL (ref 6.0–8.5)

## 2017-12-02 LAB — BMP8+EGFR
BUN/Creatinine Ratio: 13 (ref 12–28)
BUN: 9 mg/dL (ref 8–27)
CHLORIDE: 104 mmol/L (ref 96–106)
CO2: 24 mmol/L (ref 20–29)
Calcium: 9.4 mg/dL (ref 8.7–10.3)
Creatinine, Ser: 0.7 mg/dL (ref 0.57–1.00)
GFR calc Af Amer: 105 mL/min/{1.73_m2} (ref >59)
GFR calc non Af Amer: 91 mL/min/{1.73_m2} (ref >59)
Glucose: 82 mg/dL (ref 65–99)
Potassium: 3.8 mmol/L (ref 3.5–5.2)
Sodium: 144 mmol/L (ref 134–144)

## 2017-12-02 LAB — LIPID PANEL
Chol/HDL Ratio: 4.2 ratio (ref 0.0–4.4)
Cholesterol, Total: 181 mg/dL (ref 100–199)
HDL: 43 mg/dL (ref >39)
LDL Calculated: 110 mg/dL — ABNORMAL HIGH (ref 0–99)
Triglycerides: 138 mg/dL (ref 0–149)
VLDL CHOLESTEROL CAL: 28 mg/dL (ref 5–40)

## 2017-12-02 LAB — THYROID PANEL WITH TSH
Free Thyroxine Index: 1.7 (ref 1.2–4.9)
T3 Uptake Ratio: 27 % (ref 24–39)
T4, Total: 6.4 ug/dL (ref 4.5–12.0)
TSH: 2.48 u[IU]/mL (ref 0.450–4.500)

## 2017-12-02 LAB — VITAMIN D 25 HYDROXY (VIT D DEFICIENCY, FRACTURES): Vit D, 25-Hydroxy: 43.5 ng/mL (ref 30.0–100.0)

## 2017-12-05 ENCOUNTER — Ambulatory Visit (INDEPENDENT_AMBULATORY_CARE_PROVIDER_SITE_OTHER): Payer: Medicare Other | Admitting: Family Medicine

## 2017-12-05 ENCOUNTER — Encounter: Payer: Self-pay | Admitting: Family Medicine

## 2017-12-05 ENCOUNTER — Ambulatory Visit (INDEPENDENT_AMBULATORY_CARE_PROVIDER_SITE_OTHER): Payer: Medicare Other

## 2017-12-05 VITALS — BP 121/72 | HR 77 | Temp 97.4°F | Ht 64.0 in | Wt 143.0 lb

## 2017-12-05 DIAGNOSIS — I10 Essential (primary) hypertension: Secondary | ICD-10-CM

## 2017-12-05 DIAGNOSIS — E7800 Pure hypercholesterolemia, unspecified: Secondary | ICD-10-CM

## 2017-12-05 DIAGNOSIS — S2096XD Insect bite (nonvenomous) of unspecified parts of thorax, subsequent encounter: Secondary | ICD-10-CM | POA: Diagnosis not present

## 2017-12-05 DIAGNOSIS — E559 Vitamin D deficiency, unspecified: Secondary | ICD-10-CM

## 2017-12-05 DIAGNOSIS — W57XXXD Bitten or stung by nonvenomous insect and other nonvenomous arthropods, subsequent encounter: Secondary | ICD-10-CM | POA: Diagnosis not present

## 2017-12-05 MED ORDER — EPINEPHRINE 0.3 MG/0.3ML IJ SOAJ: 0.3000 mg | INTRAMUSCULAR | 3 refills | Status: DC | PRN

## 2017-12-05 MED ORDER — PITAVASTATIN CALCIUM 4 MG PO TABS
ORAL_TABLET | ORAL | 3 refills | Status: DC
Start: 2017-12-05 — End: 2018-06-05

## 2017-12-05 NOTE — Patient Instructions (Addendum)
Medicare Annual Wellness Visit  [place] and the medical providers at [facility] strive to bring you the best medical care.  In doing so we not only want to address your current medical conditions and concerns but also to detect new conditions early and prevent illness, disease and health-related problems.    Medicare offers a yearly Wellness Visit which allows our clinical staff to assess your need for preventative services including immunizations, lifestyle education, counseling to decrease risk of preventable diseases and screening for fall risk and other medical concerns.    This visit is provided free of charge (no copay) for all Medicare recipients. The clinical pharmacists at [facility] have begun to conduct these Wellness Visits which will also include a thorough review of all your medications.    As you primary medical provider recommend that you make an appointment for your Annual Wellness Visit if you have not done so already this year.  You may set up this appointment before you leave today or you may call back [phone] and schedule an appointment.  Please make sure when you call that you mention that you are scheduling your Annual Wellness Visit with the clinical pharmacist so that the appointment may be made for the proper length of time.     Continue current medications. Continue good therapeutic lifestyle changes which include good diet and exercise. Fall precautions discussed with patient. If an FOBT was given today- please return it to our front desk. If you are over 65 years old - you may need Prevnar 13 or the adult Pneumonia vaccine.  **Flu shots are available--- please call and schedule a FLU-CLINIC appointment**  After your visit with us today you will receive a survey in the mail or online from [employer] regarding your care with us. Please take a moment to fill this out. Your feedback is very  important to us as you can help us better understand your patient needs as well as improve your experience and satisfaction. WE CARE ABOUT YOU!!!   Because of your muscle aches and myalgias we will ask you to discontinue the atorvastatin and try Livalo 2 mg or one half of the 4 mg sample as soon as we get this in for you to try.  Give the atorvastatin a couple weeks of washing out of your system before you start the Livalo 2 mg daily at bedtime.  Call back and if we have some samples we can try the samples long enough to see if he can tolerate this better. Stay active physically and continue to watch her diet and keep weight down through exercise and diet Get your mammograms yearly and your pelvic exams as determined by the gynecologist Increase vitamin D3 to 5000 units daily

## 2017-12-05 NOTE — Progress Notes (Signed)
Subjective:    Patient ID: Candice Beck, female    DOB: 1950-09-06, 67 y.o.   MRN: [id number]  HPI Pt here for follow up and management of chronic medical problems which includes hyperlipidemia and hypertension. She is taking medication regularly.  Patient is doing well overall.  She has had lab work done and this will be reviewed with her during the visit today.  All of the thyroid tests were normal.  The CBC was within normal limits with a good hemoglobin at 12.6 and adequate CBC including a white blood cell count and platelet count.  Blood sugar was good at 82 creatinine and electrolytes were normal cholesterol numbers with traditional lipid testing have increased with an LDL C going up to 110.  Good cholesterol remains within normal limits and the triglycerides are within normal limits at 138.  The vitamin D level was good at 43.5 all liver function tests were normal.  The patient is doing well overall.  She has been reducing her atorvastatin because of muscle aches and joint aches.  She is now taking about 10 mg a day and this accounts for the elevated LDL-C.  We reviewed that history of elevated cholesterol and heart disease.  We will consider trying a different statin drug to see if she can tolerate another one better when she is wash the atorvastatin out of her system.  She denies any chest pain pressure tightness or shortness of breath.  She is active physically.  She does have occasional indigestion and associates this with certain foods including apples carrots and certain fruits.  We did talk about the coverings on the fruits and how sometimes these can make people more sensitive.  She has had a colonoscopy and was told she did not need another one for 10 years and this was like 1 year ago.  She denies any blood in the stool or black tarry bowel movements and is passing her water without problems.  She will increase her vitamin D3 to 5000 units daily.  We will have a washout.  With her  atorvastatin and try some samples of Livalo to see if she may tolerate this better.    Patient Active Problem List   Diagnosis Date Noted  . Osteopenia 03/07/2014  . Hypertension 04/01/2013  . Hyperlipidemia 04/01/2013  . Vitamin D deficiency 04/01/2013   Outpatient Encounter Medications as of 12/05/2017  Medication Sig  . atorvastatin (LIPITOR) 40 MG tablet TAKE 1 TABLET DAILY  . cholecalciferol (VITAMIN D) 1000 UNITS tablet Take 5,000 Units by mouth daily. 3 times weekly  . Coenzyme Q10 (CO Q 10 PO) Take 1 tablet by mouth daily.  Marland Kitchen EPINEPHrine 0.3 mg/0.3 mL IJ SOAJ injection Inject 0.3 mLs (0.3 mg total) into the muscle as needed (for anaphylactic / allergic reaction).  . hydrochlorothiazide (HYDRODIURIL) 12.5 MG tablet Take 1 tablet (12.5 mg total) by mouth daily.  . mometasone (ELOCON) 0.1 % cream Apply 1 application topically daily.  . quinapril (ACCUPRIL) 20 MG tablet Take 1 tablet (20 mg total) by mouth daily.  . valACYclovir (VALTREX) 1000 MG tablet Take 1 tablet (1,000 mg total) by mouth daily as needed.  . [DISCONTINUED] scopolamine (TRANSDERM-SCOP, 1.5 MG,) 1 MG/3DAYS Place 1 patch (1.5 mg total) onto the skin every 3 (three) days.   No facility-administered encounter medications on file as of 12/05/2017.      Review of Systems  Constitutional: Negative.   HENT: Negative.   Eyes: Negative.   Respiratory: Negative.  Cardiovascular: Negative.   Gastrointestinal: Negative.   Endocrine: Negative.   Genitourinary: Negative.   Skin: Negative.   Allergic/Immunologic: Negative.   Neurological: Negative.   Hematological: Negative.   Psychiatric/Behavioral: Negative.        Objective:   Physical Exam  Constitutional: She is oriented to person, place, and time. She appears well-developed and well-nourished. No distress.  The patient is pleasant and relaxed  HENT:  Head: Normocephalic and atraumatic.  Right Ear: External ear normal.  Left Ear: External ear normal.    Nose: Nose normal.  Mouth/Throat: Oropharynx is clear and moist. No oropharyngeal exudate.  Eyes: Pupils are equal, round, and reactive to light. Conjunctivae and EOM are normal. Right eye exhibits no discharge. Left eye exhibits no discharge. No scleral icterus.  Up-to-date on eye exams  Neck: Normal range of motion. Neck supple. No thyromegaly present.  No bruits thyromegaly or anterior cervical adenopathy  Cardiovascular: Normal rate, regular rhythm, normal heart sounds and intact distal pulses.  No murmur heard. The heart is regular at 60/min with good pedal pulses and no murmur  Pulmonary/Chest: Effort normal and breath sounds normal. She has no wheezes. She has no rales.  Clear anteriorly and posteriorly  Abdominal: Soft. Bowel sounds are normal. She exhibits no mass. There is no tenderness.  No liver or spleen enlargement no tenderness no epigastric tenderness no masses and no inguinal adenopathy  Musculoskeletal: Normal range of motion. She exhibits no edema.  Lymphadenopathy:    She has no cervical adenopathy.  Neurological: She is alert and oriented to person, place, and time. She has normal reflexes. No cranial nerve deficit.  Skin: Skin is warm and dry. No rash noted. No erythema.  Insect bite right lateral chest beneath axilla and other insect bites on body beneath clothing.  Psychiatric: She has a normal mood and affect. Her behavior is normal. Judgment and thought content normal.  Nursing note and vitals reviewed.  BP 121/72 (BP Location: Left Arm)   Pulse 77   Temp (!) 97.4 F (36.3 C) (Oral)   Ht 5\' 4"  (1.626 m)   Wt 143 lb (64.9 kg)   BMI 24.55 kg/m         Assessment & Plan:  1. Vitamin D deficiency -Increase vitamin D3 to 5000 units  2. Essential hypertension -Blood pressure is good today and she will continue with current treatment and continue to watch sodium intake - DG Chest 2 View; Future  3. Pure hypercholesterolemia -Patient has had some  intolerance to atorvastatin with increased joint aches and pains.  On her own she had reduce this down to about 10 mg daily and subsequently the LDL C has increased.  She will let this wash out of her system and discontinue the atorvastatin and in a couple weeks we will try some samples of Livalo 4 mg 1/2 pill daily to see if she tolerates this better.  If she does tolerate it we will just ask her to come in and about 3 months from now and get a repeat lipid liver panel fasting.  If she does not tolerate she will discontinue this also. - DG Chest 2 View; Future - Hepatic function panel; Future - Lipid panel; Future  4. Insect bite of thoracic wall, unspecified part, subsequent encounter -Continue to watch closely for tick bites as well as insect bites in general. -Consider trying Effie Shyoleman, DEET free spray for insects and tick bites.  Meds ordered this encounter  Medications  . EPINEPHrine 0.3 mg/0.3  mL IJ SOAJ injection    Sig: Inject 0.3 mLs (0.3 mg total) into the muscle as needed (for anaphylactic / allergic reaction).    Dispense:  2 Device    Refill:  3  . Pitavastatin Calcium 4 MG TABS    Sig: Take 1 tab QD as directed    Dispense:  30 tablet    Refill:  3   Patient Instructions                       Medicare Annual Wellness Visit  [place] and the medical providers at [facility] strive to bring you the best medical care.  In doing so we not only want to address your current medical conditions and concerns but also to detect new conditions early and prevent illness, disease and health-related problems.    Medicare offers a yearly Wellness Visit which allows our clinical staff to assess your need for preventative services including immunizations, lifestyle education, counseling to decrease risk of preventable diseases and screening for fall risk and other medical concerns.    This visit is provided free of charge (no copay) for all Medicare recipients. The  clinical pharmacists at [facility] have begun to conduct these Wellness Visits which will also include a thorough review of all your medications.    As you primary medical provider recommend that you make an appointment for your Annual Wellness Visit if you have not done so already this year.  You may set up this appointment before you leave today or you may call back [phone]) and schedule an appointment.  Please make sure when you call that you mention that you are scheduling your Annual Wellness Visit with the clinical pharmacist so that the appointment may be made for the proper length of time.     Continue current medications. Continue good therapeutic lifestyle changes which include good diet and exercise. Fall precautions discussed with patient. If an FOBT was given today- please return it to our front desk. If you are over 78 years old - you may need Prevnar 13 or the adult Pneumonia vaccine.  **Flu shots are available--- please call and schedule a FLU-CLINIC appointment**  After your visit with [place] today you will receive a survey in the mail or online from [employer] regarding your care with [place]. Please take a moment to fill this out. Your feedback is very important to [place] as you can help [place] better understand your patient needs as well as improve your experience and satisfaction. WE CARE ABOUT YOU!!!   Because of your muscle aches and myalgias we will ask you to discontinue the atorvastatin and try Livalo 2 mg or one half of the 4 mg sample as soon as we get this in for you to try.  Give the atorvastatin a couple weeks of washing out of your system before you start the Livalo 2 mg daily at bedtime.  Call back and if we have some samples we can try the samples long enough to see if he can tolerate this better. Stay active physically and continue to watch her diet and keep weight down through exercise and diet Get your mammograms yearly and your pelvic exams as  determined by the gynecologist Increase vitamin D3 to 5000 units daily  Nyra Capes MD

## 2018-02-09 ENCOUNTER — Other Ambulatory Visit: Payer: Self-pay | Admitting: Family Medicine

## 2018-02-23 LAB — HM MAMMOGRAPHY

## 2018-06-04 ENCOUNTER — Other Ambulatory Visit: Payer: Medicare Other

## 2018-06-04 DIAGNOSIS — E559 Vitamin D deficiency, unspecified: Secondary | ICD-10-CM

## 2018-06-04 DIAGNOSIS — Z0000 Encounter for general adult medical examination without abnormal findings: Secondary | ICD-10-CM

## 2018-06-04 DIAGNOSIS — E7800 Pure hypercholesterolemia, unspecified: Secondary | ICD-10-CM

## 2018-06-04 DIAGNOSIS — I10 Essential (primary) hypertension: Secondary | ICD-10-CM

## 2018-06-05 ENCOUNTER — Ambulatory Visit (INDEPENDENT_AMBULATORY_CARE_PROVIDER_SITE_OTHER): Payer: Medicare Other | Admitting: Family Medicine

## 2018-06-05 ENCOUNTER — Encounter: Payer: Self-pay | Admitting: Family Medicine

## 2018-06-05 ENCOUNTER — Ambulatory Visit (INDEPENDENT_AMBULATORY_CARE_PROVIDER_SITE_OTHER): Payer: Medicare Other

## 2018-06-05 VITALS — BP 135/68 | HR 76 | Temp 98.3°F | Ht 64.0 in | Wt 138.0 lb

## 2018-06-05 DIAGNOSIS — M722 Plantar fascial fibromatosis: Secondary | ICD-10-CM

## 2018-06-05 DIAGNOSIS — I10 Essential (primary) hypertension: Secondary | ICD-10-CM

## 2018-06-05 DIAGNOSIS — Z0001 Encounter for general adult medical examination with abnormal findings: Secondary | ICD-10-CM | POA: Diagnosis not present

## 2018-06-05 DIAGNOSIS — E7800 Pure hypercholesterolemia, unspecified: Secondary | ICD-10-CM | POA: Diagnosis not present

## 2018-06-05 DIAGNOSIS — E559 Vitamin D deficiency, unspecified: Secondary | ICD-10-CM | POA: Diagnosis not present

## 2018-06-05 DIAGNOSIS — Z0000 Encounter for general adult medical examination without abnormal findings: Secondary | ICD-10-CM

## 2018-06-05 DIAGNOSIS — M79672 Pain in left foot: Secondary | ICD-10-CM | POA: Diagnosis not present

## 2018-06-05 LAB — BMP8+EGFR
BUN / CREAT RATIO: 16 (ref 12–28)
BUN: 13 mg/dL (ref 8–27)
CO2: 23 mmol/L (ref 20–29)
CREATININE: 0.83 mg/dL (ref 0.57–1.00)
Calcium: 9.8 mg/dL (ref 8.7–10.3)
Chloride: 101 mmol/L (ref 96–106)
GFR calc Af Amer: 86 mL/min/{1.73_m2} (ref >59)
GFR calc non Af Amer: 74 mL/min/{1.73_m2} (ref >59)
GLUCOSE: 83 mg/dL (ref 65–99)
Potassium: 3.9 mmol/L (ref 3.5–5.2)
Sodium: 145 mmol/L — ABNORMAL HIGH (ref 134–144)

## 2018-06-05 LAB — HEPATIC FUNCTION PANEL
ALT: 10 IU/L (ref 0–32)
AST: 17 IU/L (ref 0–40)
Albumin: 4.5 g/dL (ref 3.6–4.8)
Alkaline Phosphatase: 101 IU/L (ref 39–117)
Bilirubin Total: 0.4 mg/dL (ref 0.0–1.2)
Bilirubin, Direct: 0.1 mg/dL (ref 0.00–0.40)
Total Protein: 6.9 g/dL (ref 6.0–8.5)

## 2018-06-05 LAB — CBC WITH DIFFERENTIAL/PLATELET
Basophils Absolute: 0.1 10*3/uL (ref 0.0–0.2)
Basos: 1 %
EOS (ABSOLUTE): 0.2 10*3/uL (ref 0.0–0.4)
EOS: 3 %
HEMATOCRIT: 40.5 % (ref 34.0–46.6)
HEMOGLOBIN: 13.7 g/dL (ref 11.1–15.9)
IMMATURE GRANS (ABS): 0 10*3/uL (ref 0.0–0.1)
IMMATURE GRANULOCYTES: 0 %
Lymphocytes Absolute: 2.3 10*3/uL (ref 0.7–3.1)
Lymphs: 33 %
MCH: 28.7 pg (ref 26.6–33.0)
MCHC: 33.8 g/dL (ref 31.5–35.7)
MCV: 85 fL (ref 79–97)
MONOCYTES: 6 %
Monocytes Absolute: 0.4 10*3/uL (ref 0.1–0.9)
NEUTROS PCT: 57 %
Neutrophils Absolute: 3.9 10*3/uL (ref 1.4–7.0)
Platelets: 355 10*3/uL (ref 150–450)
RBC: 4.77 x10E6/uL (ref 3.77–5.28)
RDW: 12.9 % (ref 12.3–15.4)
WBC: 6.8 10*3/uL (ref 3.4–10.8)

## 2018-06-05 LAB — LIPID PANEL
CHOL/HDL RATIO: 5.2 ratio — AB (ref 0.0–4.4)
Cholesterol, Total: 240 mg/dL — ABNORMAL HIGH (ref 100–199)
HDL: 46 mg/dL (ref >39)
LDL CALC: 158 mg/dL — AB (ref 0–99)
TRIGLYCERIDES: 181 mg/dL — AB (ref 0–149)
VLDL Cholesterol Cal: 36 mg/dL (ref 5–40)

## 2018-06-05 LAB — THYROID PANEL WITH TSH
Free Thyroxine Index: 2.1 (ref 1.2–4.9)
T3 UPTAKE RATIO: 28 % (ref 24–39)
T4, Total: 7.5 ug/dL (ref 4.5–12.0)
TSH: 1.8 u[IU]/mL (ref 0.450–4.500)

## 2018-06-05 LAB — VITAMIN D 25 HYDROXY (VIT D DEFICIENCY, FRACTURES): Vit D, 25-Hydroxy: 43.6 ng/mL (ref 30.0–100.0)

## 2018-06-05 MED ORDER — SCOPOLAMINE 1 MG/3DAYS TD PT72: 1.0000 | MEDICATED_PATCH | TRANSDERMAL | 1 refills | Status: DC

## 2018-06-05 MED ORDER — VALACYCLOVIR HCL 1 G PO TABS: 1000.0000 mg | ORAL_TABLET | Freq: Every day | ORAL | 3 refills | Status: DC | PRN

## 2018-06-05 MED ORDER — QUINAPRIL HCL 20 MG PO TABS: 20.0000 mg | ORAL_TABLET | Freq: Every day | ORAL | 3 refills | Status: DC

## 2018-06-05 MED ORDER — EPINEPHRINE 0.3 MG/0.3ML IJ SOAJ: 0.3000 mg | INTRAMUSCULAR | 3 refills | Status: DC | PRN

## 2018-06-05 MED ORDER — MOMETASONE FUROATE 0.1 % EX CREA: 1.0000 "application " | TOPICAL_CREAM | Freq: Every day | CUTANEOUS | 3 refills | Status: DC

## 2018-06-05 MED ORDER — HYDROCHLOROTHIAZIDE 12.5 MG PO TABS: 12.5000 mg | ORAL_TABLET | Freq: Every day | ORAL | 3 refills | Status: DC

## 2018-06-05 NOTE — Addendum Note (Signed)
Addended by: Magdalene RiverBULLINS, [place] Ganoe H on: 06/05/2018 09:14 AM   Modules accepted: Orders

## 2018-06-05 NOTE — Patient Instructions (Addendum)
Medicare Annual Wellness Visit  [place] and the medical providers at [facility] strive to bring you the best medical care.  In doing so we not only want to address your current medical conditions and concerns but also to detect new conditions early and prevent illness, disease and health-related problems.    Medicare offers a yearly Wellness Visit which allows our clinical staff to assess your need for preventative services including immunizations, lifestyle education, counseling to decrease risk of preventable diseases and screening for fall risk and other medical concerns.    This visit is provided free of charge (no copay) for all Medicare recipients. The clinical pharmacists at [facility] have begun to conduct these Wellness Visits which will also include a thorough review of all your medications.    As you primary medical provider recommend that you make an appointment for your Annual Wellness Visit if you have not done so already this year.  You may set up this appointment before you leave today or you may call back [phone] and schedule an appointment.  Please make sure when you call that you mention that you are scheduling your Annual Wellness Visit with the clinical pharmacist so that the appointment may be made for the proper length of time.     Continue current medications. Continue good therapeutic lifestyle changes which include good diet and exercise. Fall precautions discussed with patient. If an FOBT was given today- please return it to our front desk. If you are over 65 years old - you may need Prevnar 13 or the adult Pneumonia vaccine.  **Flu shots are available--- please call and schedule a FLU-CLINIC appointment**  After your visit with us today you will receive a survey in the mail or online from [employer] regarding your care with us. Please take a moment to fill this out. Your feedback is very  important to us as you can help us better understand your patient needs as well as improve your experience and satisfaction. WE CARE ABOUT YOU!!!   Check with a shoe market about shoes that you can wear to help plantar fasciitis Try to continue to do well with diet and exercise If any muscle aches or myalgias with Livalo discontinue it Repeat lipid liver panel in 6 weeks fasting

## 2018-06-05 NOTE — Progress Notes (Signed)
Subjective:    Patient ID: Candice Beck, female    DOB: 12-21-1950, 67 y.o.   MRN: [id number]  HPI Pt here for follow up and management of chronic medical problems which includes hypertension and hyperlipidemia. She is taking medication regularly.  The patient has no specific complaints today other than some left heel pain which goes into her ankle.  She does request refills on all of her medicines.  Her blood pressure was elevated initially but on the repeat it was 135/68.  She will be given an FOBT to return and she has had lab work and we will review this during her visit today.  The blood sugar and all of the electrolytes as well as the creatinine were good except the sodium was elevated by one-point at 145.  The CBC was within normal limits.  Cholesterol numbers however were much more elevated than previously with an LDL-C being 158.  Triglycerides are elevated at 181 and the total cholesterol was 240.  She is supposed be taking Livalo 4 mg daily.  The vitamin D level was good at 43.6 all liver function tests were normal all thyroid function tests were normal.  Patient has hyperlipidemia and hypertension.  Family history is positive for heart disease and breast and throat cancer.  Stop taking her atorvastatin and never tried the Livalo that was offered to her at her visit in the summertime.  With the atorvastatin taking 20 mg a day she did have problems with muscle aches and myalgias.  She has been watching her diet closely but apparently this is not good enough for getting her cholesterol down.  She does complain of left heel pain which is worse in the morning with getting out of the bed and worse after getting up from a sitting position.  She denies any chest pain pressure tightness or shortness of breath anymore than usual.  She has no trouble with nausea vomiting heartburn indigestion blood in the stool or black tarry bowel movements.  She does have some trouble swallowing occasionally and does  notice this especially with eating rice.  This does not seem to be getting any worse.  She did have a colonoscopy last year and everything was stable with that.  She is passing her water without problems and is up-to-date on her eye exams.    Patient Active Problem List   Diagnosis Date Noted  . Osteopenia 03/07/2014  . Hypertension 04/01/2013  . Hyperlipidemia 04/01/2013  . Vitamin D deficiency 04/01/2013   Outpatient Encounter Medications as of 06/05/2018  Medication Sig  . cholecalciferol (VITAMIN D) 1000 UNITS tablet Take 5,000 Units by mouth daily. 3 times weekly  . Coenzyme Q10 (CO Q 10 PO) Take 1 tablet by mouth daily.  Marland Kitchen EPINEPHRINE 0.3 mg/0.3 mL IJ SOAJ injection INJECT 0.3 MLS (0.3 MG TOTAL) INTO THE MUSCLE AS NEEDED (FOR ANAPHYLACTIC / ALLERGIC REACTION).  . hydrochlorothiazide (HYDRODIURIL) 12.5 MG tablet Take 1 tablet (12.5 mg total) by mouth daily.  . mometasone (ELOCON) 0.1 % cream Apply 1 application topically daily.  . quinapril (ACCUPRIL) 20 MG tablet Take 1 tablet (20 mg total) by mouth daily.  . valACYclovir (VALTREX) 1000 MG tablet Take 1 tablet (1,000 mg total) by mouth daily as needed.  . [DISCONTINUED] atorvastatin (LIPITOR) 40 MG tablet TAKE 1 TABLET DAILY  . [DISCONTINUED] Pitavastatin Calcium 4 MG TABS Take 1 tab QD as directed   No facility-administered encounter medications on file as of 06/05/2018.  Review of Systems  Constitutional: Negative.   HENT: Negative.   Eyes: Negative.   Respiratory: Negative.   Cardiovascular: Negative.   Gastrointestinal: Negative.   Endocrine: Negative.   Genitourinary: Negative.   Musculoskeletal: Negative.        Left heel pain   Skin: Negative.   Allergic/Immunologic: Negative.   Neurological: Negative.   Hematological: Negative.   Psychiatric/Behavioral: Negative.        Objective:   Physical Exam Vitals signs and nursing note reviewed.  Constitutional:      Appearance: Normal appearance. She is  well-developed and normal weight.     Comments: Patient is pleasant and alert  HENT:     Head: Normocephalic and atraumatic.     Right Ear: Tympanic membrane, ear canal and external ear normal. There is no impacted cerumen.     Left Ear: Tympanic membrane, ear canal and external ear normal. There is no impacted cerumen.     Nose: Nose normal. No congestion or rhinorrhea.     Mouth/Throat:     Mouth: Mucous membranes are moist.     Pharynx: Oropharynx is clear. No posterior oropharyngeal erythema.  Eyes:     General: No scleral icterus.       Right eye: No discharge.        Left eye: No discharge.     Conjunctiva/sclera: Conjunctivae normal.     Pupils: Pupils are equal, round, and reactive to light.  Neck:     Musculoskeletal: Normal range of motion and neck supple.     Thyroid: No thyromegaly.     Vascular: No carotid bruit or JVD.     Comments: No thyromegaly anterior cervical adenopathy or bruits Cardiovascular:     Rate and Rhythm: Normal rate and regular rhythm.     Pulses: Normal pulses.     Heart sounds: Normal heart sounds. No murmur.     Comments: The heart is regular at 72/min with no edema and normal pedal pulses Pulmonary:     Effort: Pulmonary effort is normal.     Breath sounds: Normal breath sounds. No wheezing or rales.     Comments: Clear anteriorly and posteriorly Abdominal:     General: Abdomen is flat. Bowel sounds are normal.     Palpations: Abdomen is soft. There is no mass.     Tenderness: There is no abdominal tenderness. There is no rebound.     Comments: No masses tenderness organ enlargement or bruits  Musculoskeletal: Normal range of motion.        General: Tenderness present.     Right lower leg: No edema.     Left lower leg: No edema.     Comments: Tenderness and left heel and foot to palpation  Lymphadenopathy:     Cervical: No cervical adenopathy.  Skin:    General: Skin is warm and dry.     Findings: No rash.  Neurological:     General:  No focal deficit present.     Mental Status: She is alert and oriented to person, place, and time. Mental status is at baseline.     Cranial Nerves: No cranial nerve deficit.     Deep Tendon Reflexes: Reflexes are normal and symmetric. Reflexes normal.  Psychiatric:        Mood and Affect: Mood normal.        Behavior: Behavior normal.        Thought Content: Thought content normal.        Judgment:  Judgment normal.     Comments: Mood affect and behavior are all normal for this patient    BP (!) 147/72 (BP Location: Left Arm)   Pulse 76   Temp 98.3 F (36.8 C) (Oral)   Ht 5\' 4"  (1.626 m)   Wt 138 lb (62.6 kg)   BMI 23.69 kg/m         Assessment & Plan:  1. Essential hypertension -Blood pressure was slightly elevated but on repeat was better. - EKG 12-Lead  2. Pure hypercholesterolemia -The patient had trouble taking atorvastatin and she has not been taking any statin drug over the past several months because of muscle aches and myalgias associated with atorvastatin.  She will try the Livalo samples we give her today and she will take half of a 4 mg pill and have a lipid liver panel done in about 6 weeks.  She will discontinue the medicine if she develops any muscle aches and myalgias and call us at that time - EKG 12-Lead - Hepatic function panel; Future  3. Vitamin D deficiency -Continue with vitamin D replacement  4. Annual physical exam - EKG 12-Lead  5. Plantar fasciitis of left foot -Wear firm soled shoe with good arch support when first getting out of the bed in the morning and wear this when around the house and do not go barefooted. - DG Os Calcis Left; Future  Meds ordered this encounter  Medications  . quinapril (ACCUPRIL) 20 MG tablet    Sig: Take 1 tablet (20 mg total) by mouth daily.    Dispense:  90 tablet    Refill:  3    Place on file  . valACYclovir (VALTREX) 1000 MG tablet    Sig: Take 1 tablet (1,000 mg total) by mouth daily as needed.     Dispense:  30 tablet    Refill:  3    Place on file  . mometasone (ELOCON) 0.1 % cream    Sig: Apply 1 application topically daily.    Dispense:  45 g    Refill:  3    Place on file  . hydrochlorothiazide (HYDRODIURIL) 12.5 MG tablet    Sig: Take 1 tablet (12.5 mg total) by mouth daily.    Dispense:  90 tablet    Refill:  3    Place on file  . EPINEPHrine 0.3 mg/0.3 mL IJ SOAJ injection    Sig: Inject 0.3 mLs (0.3 mg total) into the muscle as needed (for anaphylactic / allergic reaction).    Dispense:  2 Device    Refill:  3    Place on file   Patient Instructions                       Medicare Annual Wellness Visit  [facility] and the medical providers at [facility] strive to bring you the best medical care.  In doing so we not only want to address your current medical conditions and concerns but also to detect new conditions early and prevent illness, disease and health-related problems.    Medicare offers a yearly Wellness Visit which allows our clinical staff to assess your need for preventative services including immunizations, lifestyle education, counseling to decrease risk of preventable diseases and screening for fall risk and other medical concerns.    This visit is provided free of charge (no copay) for all Medicare recipients. The clinical pharmacists at [facility] have begun to conduct these  Wellness Visits which will also include a thorough review of all your medications.    As you primary medical provider recommend that you make an appointment for your Annual Wellness Visit if you have not done so already this year.  You may set up this appointment before you leave today or you may call back ([phone]) and schedule an appointment.  Please make sure when you call that you mention that you are scheduling your Annual Wellness Visit with the clinical pharmacist so that the appointment may be made for the proper length of time.       Continue current medications. Continue good therapeutic lifestyle changes which include good diet and exercise. Fall precautions discussed with patient. If an FOBT was given today- please return it to our front desk. If you are over 47 years old - you may need Prevnar 13 or the adult Pneumonia vaccine.  **Flu shots are available--- please call and schedule a FLU-CLINIC appointment**  After your visit with [place] today you will receive a survey in the mail or online from [employer] regarding your care with [place]. Please take a moment to fill this out. Your feedback is very important to [place] as you can help [place] better understand your patient needs as well as improve your experience and satisfaction. WE CARE ABOUT YOU!!!   Check with a shoe market about shoes that you can wear to help plantar fasciitis Try to continue to do well with diet and exercise If any muscle aches or myalgias with Livalo discontinue it Repeat lipid liver panel in 6 weeks fasting  Nyra Capes MD

## 2018-07-18 ENCOUNTER — Other Ambulatory Visit: Payer: Medicare Other

## 2018-07-18 DIAGNOSIS — E7800 Pure hypercholesterolemia, unspecified: Secondary | ICD-10-CM

## 2018-07-19 LAB — HEPATIC FUNCTION PANEL
ALT: 13 IU/L (ref 0–32)
AST: 15 IU/L (ref 0–40)
Albumin: 4.2 g/dL (ref 3.8–4.8)
Alkaline Phosphatase: 92 IU/L (ref 39–117)
Bilirubin Total: 0.5 mg/dL (ref 0.0–1.2)
Bilirubin, Direct: 0.1 mg/dL (ref 0.00–0.40)
TOTAL PROTEIN: 6.6 g/dL (ref 6.0–8.5)

## 2018-08-14 ENCOUNTER — Telehealth: Payer: Self-pay | Admitting: Family Medicine

## 2018-08-14 MED ORDER — PITAVASTATIN CALCIUM 2 MG PO TABS: 2.0000 mg | ORAL_TABLET | Freq: Every day | ORAL | 3 refills | Status: DC

## 2018-08-14 NOTE — Telephone Encounter (Signed)
PT is wanting to leave a message for Asher Muir, pt is needing a rx written for the samples of the new cholesterol medicine that they had tried to pt on    Pharmacy [facility]

## 2018-08-14 NOTE — Telephone Encounter (Signed)
Livalo 2mg  sent to pharmacy

## 2018-09-06 ENCOUNTER — Ambulatory Visit (INDEPENDENT_AMBULATORY_CARE_PROVIDER_SITE_OTHER): Payer: Medicare Other | Admitting: Orthopaedic Surgery

## 2018-10-30 ENCOUNTER — Ambulatory Visit: Payer: Medicare Other | Admitting: Orthopaedic Surgery

## 2018-10-30 ENCOUNTER — Encounter: Payer: Self-pay | Admitting: Orthopaedic Surgery

## 2018-10-30 ENCOUNTER — Other Ambulatory Visit: Payer: Self-pay

## 2018-10-30 VITALS — BP 151/71 | HR 95 | Ht 64.5 in | Wt 138.0 lb

## 2018-10-30 DIAGNOSIS — M722 Plantar fascial fibromatosis: Secondary | ICD-10-CM | POA: Diagnosis not present

## 2018-10-30 NOTE — Progress Notes (Signed)
Office Visit Note   Patient: Candice Beck           Date of Birth: Nov 14, 1950           MRN: [id number] Visit Date: 10/30/2018              Requested by: Ernestina Penna, MD [address] [place], [place] [address] PCP: Ernestina Penna, MD   Assessment & Plan: Visit Diagnoses:  1. Plantar fasciitis of left foot     Plan: Plantar fasciitis left foot.  Long discussion regarding treatment options.  Will work on exercises and icing at night.  I have inserted full arch supports and will ask her to monitor her response.  Consider cortisone injection.  I think the pain in the area of her left knee is referred from her foot as her knee exam was completely benign  Follow-Up Instructions: Return if symptoms worsen or fail to improve.   Orders:  No orders of the defined types were placed in this encounter.  No orders of the defined types were placed in this encounter.     Procedures: No procedures performed   Clinical Data: No additional findings.   Subjective: Chief Complaint  Patient presents with  . Left Foot - Pain  Patient presents today with left foot pain. She has been having pain in her heel since October of 2019. She said that she saw her PCP in December and had x-rays taken. She was told that she had a spur. The pain is on and off, but worse upon getting up after any rest. She is also complaining of pain in her posterior knee. The pain is on and off in her knee.  I did review films of her left foot on the PACS system from December 2019.  There is a prominent plantar heel spur.  There also is some prominence of the talus anteriorly that might predispose to anterior impingement but presently is asymptomatic  HPI  Review of Systems  Constitutional: Negative for fatigue.  HENT: Negative for ear pain.   Eyes: Negative for pain.  Respiratory: Negative for shortness of breath.   Cardiovascular: Negative for leg swelling.  Gastrointestinal: Negative for constipation and  diarrhea.  Endocrine: Negative for cold intolerance and heat intolerance.  Genitourinary: Negative for difficulty urinating.  Musculoskeletal: Negative for joint swelling.  Skin: Negative for rash.  Allergic/Immunologic: Negative for food allergies.  Neurological: Negative for weakness.  Hematological: Does not bruise/bleed easily.  Psychiatric/Behavioral: Negative for sleep disturbance.     Objective: Vital Signs: BP (!) 151/71   Pulse 95   Ht 5' 4.5" (1.638 m)   Wt 138 lb (62.6 kg)   BMI 23.32 kg/m   Physical Exam Constitutional:      Appearance: She is well-developed.  Eyes:     Pupils: Pupils are equal, round, and reactive to light.  Pulmonary:     Effort: Pulmonary effort is normal.  Skin:    General: Skin is warm and dry.  Neurological:     Mental Status: She is alert and oriented to person, place, and time.  Psychiatric:        Behavior: Behavior normal.     Ortho Exam awake alert and oriented x3.  Comfortable sitting.  Examination of left foot reveals pes planus.  There was very minimal discomfort over the plantar aspect of her foot near the plantar fascial insertion on the os calcis.  Good pulses.  Good capillary refill.  Neurologically intact.  No ankle pain.  Left knee without effusion.  Full flexion extension.  No instability.  No popliteal pain or mass.  No calf pain.  Straight leg raise negative.  Painless range of motion both hips  Specialty Comments:  No specialty comments available.  Imaging: No results found.   PMFS History: Patient Active Problem List   Diagnosis Date Noted  . Plantar fasciitis of left foot 10/30/2018  . Osteopenia 03/07/2014  . Hypertension 04/01/2013  . Hyperlipidemia 04/01/2013  . Vitamin D deficiency 04/01/2013   Past Medical History:  Diagnosis Date  . Hyperlipidemia   . Hypertension   . Vitamin D deficiency     Family History  Problem Relation Age of Onset  . Cancer Mother        breast   . Heart disease Mother         aortic valve replaced x2  . Osteoporosis Mother   . Cancer Father        throat  . Heart disease Brother        heart attack age 66  . Diabetes Brother        due to weight  . Hypertension Brother   . Hypertension Brother   . Cancer Maternal Aunt        breast    Past Surgical History:  Procedure Laterality Date  . TONSILLECTOMY     Social History   Occupational History  . Not on file  Tobacco Use  . Smoking status: Never Smoker  . Smokeless tobacco: Never Used  Substance and Sexual Activity  . Alcohol use: Yes  . Drug use: No  . Sexual activity: Yes

## 2018-11-02 ENCOUNTER — Ambulatory Visit: Payer: Medicare Other | Admitting: Family Medicine

## 2018-11-08 ENCOUNTER — Telehealth: Payer: Self-pay | Admitting: Family Medicine

## 2018-11-22 ENCOUNTER — Encounter: Payer: Self-pay | Admitting: *Deleted

## 2018-11-22 ENCOUNTER — Ambulatory Visit (INDEPENDENT_AMBULATORY_CARE_PROVIDER_SITE_OTHER): Payer: Medicare Other | Admitting: *Deleted

## 2018-11-22 ENCOUNTER — Other Ambulatory Visit: Payer: Self-pay

## 2018-11-22 DIAGNOSIS — Z0000 Encounter for general adult medical examination without abnormal findings: Secondary | ICD-10-CM

## 2018-11-22 NOTE — Patient Instructions (Signed)
  Candice Beck , Thank you for taking time to come in for your Medicare Wellness Visit. I appreciate your ongoing commitment to your health goals. Please review the following plan we discussed and let me know if I can assist you in the future.   These are the goals we discussed: Goals    . Increase physical activity     Walk at least times per week for 30 minutes each session.        This is a list of the screening recommended for you and due dates:  Health Maintenance  Topic Date Due  . Stool Blood Test  04/28/2016  . Pneumonia vaccines (1 of 2 - PCV13) 11/15/2017  . Flu Shot  01/19/2019  . Mammogram  02/24/2020  . Tetanus Vaccine  09/28/2021  . Colon Cancer Screening  12/28/2026  . DEXA scan (bone density measurement)  Completed  .  Hepatitis C: One time screening is recommended by Center for Disease Control  (CDC) for  adults born from [birth year] through 1963.   Completed

## 2018-11-22 NOTE — Progress Notes (Signed)
MEDICARE ANNUAL WELLNESS VISIT  11/22/2018  Telephone Visit Disclaimer This Medicare AWV was conducted by telephone due to national recommendations for restrictions regarding the COVID-19 Pandemic (e.g. social distancing).  I verified, using two identifiers, that I am speaking with Candice Beck or their authorized healthcare agent. I discussed the limitations, risks, security, and privacy concerns of performing an evaluation and management service by telephone and the potential availability of an in-person appointment in the future. The patient expressed understanding and agreed to proceed.   Subjective:  Candice Beck is a 68 y.o. female patient of Ernestina Penna, MD who had a Medicare Annual Wellness Visit today via telephone. Candice Beck is retired and lives with her husband. She has 2 children and 4 grandchildren. She reports that she is socially active and does interact with friends/family regularly. She walks her dog a few times per day, but does not do any other regular exercise.  She enjoys traveling, sewing, crocheting, and knitting.  She is active in her church.   Patient Care Team: Ernestina Penna, MD as PCP - General (Family Medicine) Valeria Batman, MD as Consulting Physician (Orthopedic Surgery)  Advanced Directives 11/22/2018  Does Patient Have a Medical Advance Directive? No  Would patient like information on creating a medical advance directive? Yes (MAU/Ambulatory/Procedural Areas - Information given)    Hospital Utilization Over the Past 12 Months: # of hospitalizations or ER visits: 0 # of surgeries: 0  Review of Systems    Patient reports that her overall health is unchanged compared to last year.   Review of Systems:   All systems negative - reported by patient.  Pain Assessment Pain Score: 0-No pain     Current Medications & Allergies (verified) Allergies as of 11/22/2018   No Known Allergies     Medication List       Accurate as of November 22, 2018  3:49 PM. If you have any questions, ask your nurse or doctor.        cholecalciferol 1000 units tablet Commonly known as:  VITAMIN D Take 5,000 Units by mouth daily.   CO Q 10 PO Take 1 tablet by mouth daily.   EPINEPHrine 0.3 mg/0.3 mL Soaj injection Commonly known as:  EPI-PEN Inject 0.3 mLs (0.3 mg total) into the muscle as needed (for anaphylactic / allergic reaction).   hydrochlorothiazide 12.5 MG tablet Commonly known as:  HYDRODIURIL Take 1 tablet (12.5 mg total) by mouth daily.   mometasone 0.1 % cream Commonly known as:  Elocon Apply 1 application topically daily.   Pitavastatin Calcium 2 MG Tabs Commonly known as:  Livalo Take 1 tablet (2 mg total) by mouth daily. What changed:  how much to take   quinapril 20 MG tablet Commonly known as:  ACCUPRIL Take 1 tablet (20 mg total) by mouth daily.   scopolamine 1 MG/3DAYS Commonly known as:  Transderm-Scop (1.5 MG) Place 1 patch (1.5 mg total) onto the skin every 3 (three) days.   valACYclovir 1000 MG tablet Commonly known as:  VALTREX Take 1 tablet (1,000 mg total) by mouth daily as needed.       History (reviewed): Past Medical History:  Diagnosis Date  . Hyperlipidemia   . Hypertension   . Vitamin D deficiency    Past Surgical History:  Procedure Laterality Date  . TONSILLECTOMY     Family History  Problem Relation Age of Onset  . Cancer Mother  breast   . Heart disease Mother        aortic valve replaced x2  . Osteoporosis Mother   . Cancer Father        throat  . Heart disease Brother        heart attack age 68  . Diabetes Brother        due to weight  . Hypertension Brother   . Hypertension Brother   . Cancer Maternal Aunt        breast   Social History   Socioeconomic History  . Marital status: Married    Spouse name: Not on file  . Number of children: 2  . Years of education: 28  . Highest education level: Not on file  Occupational History  . Occupation: Retired     Associate Professor: [employer]    Comment: Book Radio broadcast assistant  Social Needs  . Financial resource strain: Not hard at all  . Food insecurity:    Worry: Never true    Inability: Never true  . Transportation needs:    Medical: No    Non-medical: No  Tobacco Use  . Smoking status: Never Smoker  . Smokeless tobacco: Never Used  Substance and Sexual Activity  . Alcohol use: Yes  . Drug use: No  . Sexual activity: Yes  Lifestyle  . Physical activity:    Days per week: 0 days    Minutes per session: 0 min  . Stress: Not at all  Relationships  . Social connections:    Talks on phone: More than three times a week    Gets together: More than three times a week    Attends religious service: More than 4 times per year    Active member of club or organization: Yes    Attends meetings of clubs or organizations: More than 4 times per year    Relationship status: Married  Other Topics Concern  . Not on file  Social History Narrative  . Not on file    Activities of Daily Living In your present state of health, do you have any difficulty performing the following activities: 11/22/2018  Hearing? N  Vision? N  Difficulty concentrating or making decisions? N  Walking or climbing stairs? N  Dressing or bathing? N  Doing errands, shopping? N  Preparing Food and eating ? N  Using the Toilet? N  In the past six months, have you accidently leaked urine? N  Do you have problems with loss of bowel control? N  Managing your Medications? N  Managing your Finances? N  Housekeeping or managing your Housekeeping? N  Some recent data might be hidden        Exercise Current Exercise Habits: The patient does not participate in regular exercise at present  Diet Patient reports consuming 3 meals a day and 1 snack(s) a day Patient reports that her primary diet is: Regular, Diabetic Patient reports that she does have regular access to food.   Depression Screen PHQ 2/9 Scores  11/22/2018 06/05/2018 12/05/2017 06/29/2017 06/01/2017 11/30/2016 06/02/2016  PHQ - 2 Score 0 0 0 0 1 1 0     Fall Risk Fall Risk  11/22/2018 06/05/2018 12/05/2017 06/29/2017 06/01/2017  Falls in the past year? 0 1 No No No  Number falls in past yr: - 0 - - -  Injury with Fall? - 0 - - -  Follow up Falls prevention discussed - - - -     Objective:  Candice Beck seemed alert and oriented and she participated appropriately during our telephone visit.  Blood Pressure Weight BMI  BP Readings from Last 3 Encounters:  10/30/18 (!) 151/71  06/05/18 135/68  12/05/17 121/72   Wt Readings from Last 3 Encounters:  10/30/18 138 lb (62.6 kg)  06/05/18 138 lb (62.6 kg)  12/05/17 143 lb (64.9 kg)   BMI Readings from Last 1 Encounters:  10/30/18 23.32 kg/m    *Unable to obtain current vital signs, weight, and BMI due to telephone visit type  Hearing/Vision  . Candice Beck did not seem to have difficulty with hearing/understanding during the telephone conversation . Reports that she has had a formal eye exam by an eye care professional within the past year . Reports that she has not had a formal hearing evaluation within the past year *Unable to fully assess hearing and vision during telephone visit type  Cognitive Function: 6CIT Screen 11/22/2018  What Year? 0 points  What month? 0 points  What time? 0 points  Count back from 20 0 points  Months in reverse 0 points  Repeat phrase 0 points  Total Score 0    Normal Cognitive Function Screening: Yes (Normal:0-7, Significant for Dysfunction: >8)  Immunization & Health Maintenance Record Immunization History  Administered Date(s) Administered  . Tdap 09/29/2011  . Zoster 09/29/2011    Health Maintenance  Topic Date Due  . COLON CANCER SCREENING ANNUAL FOBT  04/28/2016  . PNA vac Low Risk Adult (1 of 2 - PCV13) 11/15/2017  . INFLUENZA VACCINE  01/19/2019  . MAMMOGRAM  02/24/2020  . TETANUS/TDAP  09/28/2021  . COLONOSCOPY  12/28/2026  .  DEXA SCAN  Completed  . Hepatitis C Screening  Completed       Assessment  This is a routine wellness examination for Candice Beck.  Health Maintenance: Due or Overdue Health Maintenance Due  Topic Date Due  . COLON CANCER SCREENING ANNUAL FOBT  04/28/2016  . PNA vac Low Risk Adult (1 of 2 - PCV13) 11/15/2017    Candice Beck does not need a referral for Community Assistance: Care Management:   no Social Work:    no Prescription Assistance:  no Nutrition/Diabetes Education:  no   Plan:  Personalized Goals Goals Addressed            This Visit's Progress   . Increase physical activity       Walk at least times per week for 30 minutes each session.       Personalized Health Maintenance & Screening Recommendations  Pneumococcal vaccine  Shingirx vaccine   Lung Cancer Screening Recommended: no (Low Dose CT Chest recommended if Age 11-80 years, 30 pack-year currently smoking OR have quit w/in past 15 years) Hepatitis C Screening recommended:  Completed 05/26/2017   Advanced Directives: Written information was prepared per patient's request.  Patient has old Advanced Directives, but interested in updating.  Paperwork sent in the mail.   Referrals & Orders N/A  Follow-up Plan . Follow-up with Ernestina Penna, MD as planned . Work on your goal of increasing your activity level.     I have personally reviewed and noted the following in the patient's chart:   . Medical and social history . Use of alcohol, tobacco or illicit drugs  . Current medications and supplements . Functional ability and status . Nutritional status . Physical activity . Advanced directives . List of other physicians . Hospitalizations, surgeries, and ER visits in previous 12 months .  Vitals . Screenings to include cognitive, depression, and falls . Referrals and appointments  In addition, I have reviewed and discussed with Candice KehrJoyce R Beck certain preventive protocols, quality  metrics, and best practice recommendations. A written personalized care plan for preventive services as well as general preventive health recommendations is available and can be mailed to the patient at her request.      Eyob Godlewski M  11/22/2018

## 2019-03-15 ENCOUNTER — Telehealth: Payer: Self-pay

## 2019-03-15 DIAGNOSIS — R928 Other abnormal and inconclusive findings on diagnostic imaging of breast: Secondary | ICD-10-CM

## 2019-03-15 NOTE — Telephone Encounter (Signed)
[facility] faxed over orders for additional views and possible [place] (if needed) of breast.  Orders signed and faxed back.  Orders placed in epic. MPulliam, CMA/RT(R)

## 2019-03-19 ENCOUNTER — Ambulatory Visit (INDEPENDENT_AMBULATORY_CARE_PROVIDER_SITE_OTHER): Payer: Medicare Other | Admitting: Family Medicine

## 2019-03-19 ENCOUNTER — Other Ambulatory Visit: Payer: Self-pay

## 2019-03-19 ENCOUNTER — Encounter: Payer: Self-pay | Admitting: Family Medicine

## 2019-03-19 VITALS — BP 144/78 | HR 77 | Temp 97.7°F | Resp 20 | Ht 64.5 in | Wt 138.0 lb

## 2019-03-19 DIAGNOSIS — M8589 Other specified disorders of bone density and structure, multiple sites: Secondary | ICD-10-CM

## 2019-03-19 DIAGNOSIS — E559 Vitamin D deficiency, unspecified: Secondary | ICD-10-CM | POA: Diagnosis not present

## 2019-03-19 DIAGNOSIS — I10 Essential (primary) hypertension: Secondary | ICD-10-CM | POA: Diagnosis not present

## 2019-03-19 DIAGNOSIS — Z87892 Personal history of anaphylaxis: Secondary | ICD-10-CM

## 2019-03-19 DIAGNOSIS — M722 Plantar fascial fibromatosis: Secondary | ICD-10-CM

## 2019-03-19 DIAGNOSIS — E7800 Pure hypercholesterolemia, unspecified: Secondary | ICD-10-CM

## 2019-03-19 DIAGNOSIS — Z13820 Encounter for screening for osteoporosis: Secondary | ICD-10-CM

## 2019-03-19 MED ORDER — EPINEPHRINE 0.3 MG/0.3ML IJ SOAJ: 0.3000 mg | INTRAMUSCULAR | 3 refills | Status: DC | PRN

## 2019-03-19 MED ORDER — LIVALO 2 MG PO TABS: 2.0000 mg | ORAL_TABLET | Freq: Every day | ORAL | 3 refills | Status: DC

## 2019-03-19 NOTE — Patient Instructions (Signed)
Your recent bone density scan demonstrated that you have osteopenia.  Osteopenia is when your bones become thinner and weaker.  This is not osteoporosis but can become osteoporosis over time.  Today, I provided you a handout reviewing calcium and vitamin D and ways to incorporate this in your diet.  You may need to take a supplement if you are unable to get sufficient amount of these minerals through food.  Strength training is just as important in maintaining bone health.  A copy of home exercises has been provided to you. You need to repeat your bone density in 2 years.   Exercise for Strong Bones  Exercise is important to build and maintain strong bones / bone density.  There are 2 types of exercises that are important to building and maintaining strong bones:  Weight- bearing and muscle-stregthening.  Weight-bearing Exercises  These exercises include activities that make you move against gravity while staying upright. Weight-bearing exercises can be high-impact or low-impact.  High-impact weight-bearing exercises help build bones and keep them strong. If you have broken a bone due to osteoporosis or are at risk of breaking a bone, you may need to avoid high-impact exercises. If you're not sure, you should check with your healthcare provider.  Examples of high-impact weight-bearing exercises are: Dancing  Doing high-impact aerobics  Hiking  Jogging/running  Jumping Rope  Stair climbing  Tennis  Low-impact weight-bearing exercises can also help keep bones strong and are a safe alternative if you cannot do high-impact exercises.   Examples of low-impact weight-bearing exercises are: Using elliptical training machines  Doing low-impact aerobics  Using stair-step machines  Fast walking on a treadmill or outside   Muscle-Strengthening Exercises These exercises include activities where you move your body, a weight or some other resistance against gravity. They are also known as resistance  exercises and include: Lifting weights  Using elastic exercise bands  Using weight machines  Lifting your own body weight  Functional movements, such as standing and rising up on your toes  Yoga and Pilates can also improve strength, balance and flexibility. However, certain positions may not be safe for people with osteoporosis or those at increased risk of broken bones. For example, exercises that have you bend forward may increase the chance of breaking a bone in the spine.   Non-Impact Exercises There are other types of exercises that can help prevent falls.  Non-impact exercises can help you to improve balance, posture and how well you move in everyday activities. Some of these exercises include: Balance exercises that strengthen your legs and test your balance, such as Tai Chi, can decrease your risk of falls.  Posture exercises that improve your posture and reduce rounded or "sloping" shoulders can help you decrease the chance of breaking a bone, especially in the spine.  Functional exercises that improve how well you move can help you with everyday activities and decrease your chance of falling and breaking a bone. For example, if you have trouble getting up from a chair or climbing stairs, you should do these activities as exercises.   A physical therapist can teach you balance, posture and functional exercises. He/she can also help you learn which exercises are safe and appropriate for you.  [facility] has a physical therapy office in [place] in front of our office and referrals can be made for assessments and treatment as needed and strength and balance training.  If you would like to have an assessment with [facility] and our physical therapy   team please let a nurse or provider know.  For more information go to the National Osteoporosis Foundation website: https://www.nof.org/.   Click the striped box in the right upper corner.   Then click through patient info in the left lower hand  corner to out more about osteoporosis and osteopenia and how you can prevent fractures.   Follow these instructions at home: Include calcium and vitamin D in your diet. Calcium is important for bone health, and vitamin D helps the body absorb calcium. Os-Cal is a great over the counter supplement to take daily. It has calcium and Vitamin D.  Perform weight-bearing and muscle-strengthening exercises as directed by your health care provider.  Do not use any tobacco products, including cigarettes, chewing tobacco, and electronic cigarettes. If you need help quitting, ask your health care provider. Limit your alcohol intake. Take medicines only as directed by your health care provider. Keep all follow-up visits as directed by your health care provider. This is important. Take precautions at home to lower your risk of falling, such as: Keeping rooms well lit and clutter free. Installing safety rails on stairs. Using rubber mats in the bathroom and other areas that are often wet or slippery.     Calcium & Vitamin D: The Facts  Why is calcium and vitamin D consumption important? Calcium: Most Americans do not consume adequate amounts of calcium! Calcium is required for proper muscle function, nerve communication, bone support, and many other functions in the body.  The body uses bones as a source of calcium. Bones 'remodel' themselves continuously - the body constantly breaks bone down to release calcium and rebuilds bones by replacing calcium in the bone later.  As we get older, the rate of bone breakdown occurs faster than bone rebuilding which could lead to osteopenia, osteoporosis, and possible fractures.   Vitamin D: People naturally make vitamin D in the body when sunlight hits the skin and triggers a process that leads to vitamin D production. This natural vitamin D production requires about 10-15 minutes of sun exposure on the hands, arms, and face at least 2-3 times per week. However,  due to decreased sun exposure and the use of sunscreen, most people will need to get additional vitamin D from foods or supplements. Your doctor can measure your body's vitamin D level through a simple blood test to determine your daily vitamin D needs.  Vitamin D is used to help the body absorb calcium, maintain bone health, help the immune system, and reduce inflammation. It also plays a role in muscle performance, balance and risk of falling.  Vitamin D deficiency can lead to osteomalacia or softening of the bones, bone pain, and muscle weakness.   The recommended daily allowance of Calcium and Vitamin D varies for different age groups. Age group Calcium (mg) Vitamin D (IU)  Females and Males: Age 19-50 1000 mg 600 IU  Females: Age 51- 70 1200 mg 600 IU  Males: Age 51-70 1000 mg 600 IU  Females and Males: Age 71+ 1200 mg 800 IU  Pregnant/lactating Females age 19-50 1000 mg 600 IU   How much Calcium do you get in your diet? Calcium Intake # of servings per day  Total calcium (mg)  Skim milk, 2% milk (1 cup) _________ x 300 mg   Yogurt (1 small container) _________ x 200 mg   Cheese (1oz) _________ x 200 mg   Cottage Cheese (1 cup)               ________ x 150 mg   Almond milk (1 cup) _________ x 450 mg   Fortified Orange Juice (1 cup) _________ x 300 mg   Broccoli or spinach ( 1 cup) _________ x 100 mg   Salmon (3 oz) _________ x 150 mg    Almonds (1/4 cup) _______ x 90 mg      How do we get Calcium and Vitamin D in our diet? Calcium: Obtaining calcium from the diet is the most preferred way to reach the recommended daily goal. If this goal is not reached through diet, calcium supplements are available.  Calcium is found in many foods including: dairy products, dark leafy vegetables (like broccoli, kale, and spinach), fish, and fortified products like juices and cereals.  The food label will have a %DV (percent daily value) listed showing the amount of calcium per serving. To determine  the total mg per serving, simply replace the % with zero (0).  For example, Almond Breeze almond milk contains 45% DV of calcium or 450mg per 1 cup.  You can increase the amount of calcium in your diet by using more calcium products in your daily meals. Use yogurt and fruit to make smoothies or use yogurt to top baked potatoes or make whipped potatoes. Sprinkle low fat cheese onto salads or into egg white omelets. You can even add non-fat dry milk powder (300mg calcium per 1/3 cup) to hot cereals, meat loaf, soups, or potatoes.  Calcium supplements come in many forms including tablets, chewables, and gummies. Be sure to read the label to determine the correct number of tablets per serving and whether or not to take the supplement with food.  Calcium carbonate products (Oscal, Caltrate, and Viactiv) are generally better absorbed when taken with food while calcium citrate products like Citracal can be taken with or without food.  The body can only absorb about 600 mg of calcium at one time. It is recommended to take calcium supplements in small amounts several times per day.  However, taking it all at once is better than not taking it at all. Increasing your intake of calcium is essential for bone health, but may also lead to some side effects like constipation, increased gas, bloating or abdominal cramping. To help reduce these side effects, start with 1 tablet per day and slowly increase your intake of the supplement to the recommended doses. It is also recommended that you drink plenty of water each day. Vitamin D: Very few foods naturally contain vitamin D. However, it is found in saltwater fish (like tuna, salmon and mackerel), beef liver, egg yolks, cheese and vitamin D fortified foods (like yogurt, cereals, orange juice and milk) The amount of vitamin D in each food or product is listed as %DV on the product label. To determine the total amount of vitamin D per serving, drop the % sign and multiply the  number by 4. For example, 1 cup of Almond Breeze almond milk contains 25% DV vitamin D or 100 IU per serving (25 x 4 =100). Vitamin D is also found in multivitamins and supplements and may be listed as ergocalciferol (vitamin D2) or cholecalciferol (vitamin D3). Each of these forms of vitamin D are equivalent and the daily recommended intake will vary based on your age and the vitamin D levels in your body. Follow your doctor's recommendation for vitamin D intake.      recommended intake will vary based on your age and the vitamin D levels in your body. Follow your doctor's recommendation for vitamin D intake.

## 2019-03-19 NOTE — Progress Notes (Signed)
Subjective:  Patient ID: Candice Beck, female    DOB: [birth year], 68 y.o.   MRN: [id number]  Patient Care Team: Chipper Herb, MD (Inactive) as PCP - General (Family Medicine) Garald Balding, MD as Consulting Physician (Orthopedic Surgery)   Chief Complaint:  Medical Management of Chronic Issues, Hypertension, and Hyperlipidemia   HPI: Candice Beck is a 68 y.o. female presenting on 03/19/2019 for Medical Management of Chronic Issues, Hypertension, and Hyperlipidemia   1. Essential hypertension  Complaint with meds - Yes Current Medications - quinapril  Checking BP at home - no Exercising Regularly - Yes Watching Salt intake - Yes Pertinent ROS:  Headache - No Fatigue - No Visual Disturbances - No Chest pain - No Dyspnea - No Palpitations - No LE edema - No They report good compliance with medications and can restate their regimen by memory. No medication side effects.  Family, social, and smoking history reviewed.   BP Readings from Last 3 Encounters:  03/19/19 (!) 144/78  10/30/18 (!) 151/71  06/05/18 135/68   CMP Latest Ref Rng & Units 07/18/2018 06/04/2018 12/01/2017  Glucose 65 - 99 mg/dL - 83 82  BUN 8 - 27 mg/dL - 13 9  Creatinine 0.57 - 1.00 mg/dL - 0.83 0.70  Sodium 134 - 144 mmol/L - 145(H) 144  Potassium 3.5 - 5.2 mmol/L - 3.9 3.8  Chloride 96 - 106 mmol/L - 101 104  CO2 20 - 29 mmol/L - 23 24  Calcium 8.7 - 10.3 mg/dL - 9.8 9.4  Total Protein 6.0 - 8.5 g/dL 6.6 6.9 6.6  Total Bilirubin 0.0 - 1.2 mg/dL 0.5 0.4 0.5  Alkaline Phos 39 - 117 IU/L 92 101 90  AST 0 - 40 IU/L _0 ALT 0 - 32 IU/L _1 2. Pure hypercholesterolemia  Compliant with medications - Yes Current medications - Livalo Side effects from medications - No Diet - generally healthy Exercise - active daily  Lab Results  Component Value Date   CHOL 240 (H) 06/04/2018   HDL 46 06/04/2018   LDLCALC 158 (H) 06/04/2018   TRIG 181 (H) 06/04/2018   CHOLHDL 5.2  (H) 06/04/2018     Family and personal medical history reviewed. Smoking and ETOH history reviewed.    3. Vitamin D deficiency  Pt is taking oral repletion therapy. Denies bone pain and tenderness, muscle weakness, fracture, and difficulty walking. Lab Results  Component Value Date   VD25OH 43.6 06/04/2018   VD25OH 43.5 12/01/2017   VD25OH 41.7 05/26/2017   Lab Results  Component Value Date   CALCIUM 9.8 06/04/2018      4. Osteopenia of multiple sites  Last DEXA in 2018. Currently not taking calcium supplements. Is taking Vit D repletion daily. No recent falls or fractures.    6. Plantar fasciitis of left foot  Followed by ortho. Symptoms have greatly improved since ortho ordered orthotics. Has follow up with ortho scheduled.      Relevant past medical, surgical, family, and social history reviewed and updated as indicated.  Allergies and medications reviewed and updated. Date reviewed: Chart in Epic.   Past Medical History:  Diagnosis Date  . Hyperlipidemia   . Hypertension   . Vitamin D deficiency     Past Surgical History:  Procedure Laterality Date  . TONSILLECTOMY      Social History   Socioeconomic History  . Marital status: Married    Spouse  name: Not on file  . Number of children: 2  . Years of education: 79  . Highest education level: Not on file  Occupational History  . Occupation: Retired    Fish farm manager: [employer]    Comment: Book Water engineer  Social Needs  . Financial resource strain: Not hard at all  . Food insecurity    Worry: Never true    Inability: Never true  . Transportation needs    Medical: No    Non-medical: No  Tobacco Use  . Smoking status: Never Smoker  . Smokeless tobacco: Never Used  Substance and Sexual Activity  . Alcohol use: Yes  . Drug use: No  . Sexual activity: Yes  Lifestyle  . Physical activity    Days per week: 0 days    Minutes per session: 0 min  . Stress: Not at all   Relationships  . Social connections    Talks on phone: More than three times a week    Gets together: More than three times a week    Attends religious service: More than 4 times per year    Active member of club or organization: Yes    Attends meetings of clubs or organizations: More than 4 times per year    Relationship status: Married  . Intimate partner violence    Fear of current or ex partner: No    Emotionally abused: No    Physically abused: No    Forced sexual activity: No  Other Topics Concern  . Not on file  Social History Narrative  . Not on file    Outpatient Encounter Medications as of 03/19/2019  Medication Sig  . cholecalciferol (VITAMIN D) 1000 UNITS tablet Take 5,000 Units by mouth daily.   . Coenzyme Q10 (CO Q 10 PO) Take 1 tablet by mouth daily.  . mometasone (ELOCON) 0.1 % cream Apply 1 application topically daily.  . Pitavastatin Calcium (LIVALO) 2 MG TABS Take 1 tablet (2 mg total) by mouth daily.  . quinapril (ACCUPRIL) 20 MG tablet Take 1 tablet (20 mg total) by mouth daily.  . [DISCONTINUED] Pitavastatin Calcium (LIVALO) 2 MG TABS Take 1 tablet (2 mg total) by mouth daily. (Patient taking differently: Take 1 mg by mouth daily. )  . EPINEPHrine 0.3 mg/0.3 mL IJ SOAJ injection Inject 0.3 mLs (0.3 mg total) into the muscle as needed (for anaphylactic / allergic reaction).  . valACYclovir (VALTREX) 1000 MG tablet Take 1 tablet (1,000 mg total) by mouth daily as needed. (Patient not taking: Reported on 03/19/2019)  . [DISCONTINUED] EPINEPHrine 0.3 mg/0.3 mL IJ SOAJ injection Inject 0.3 mLs (0.3 mg total) into the muscle as needed (for anaphylactic / allergic reaction). (Patient not taking: Reported on 03/19/2019)  . [DISCONTINUED] hydrochlorothiazide (HYDRODIURIL) 12.5 MG tablet Take 1 tablet (12.5 mg total) by mouth daily. (Patient not taking: Reported on 11/22/2018)  . [DISCONTINUED] scopolamine (TRANSDERM-SCOP, 1.5 MG,) 1 MG/3DAYS Place 1 patch (1.5 mg total) onto  the skin every 3 (three) days. (Patient not taking: Reported on 11/22/2018)   No facility-administered encounter medications on file as of 03/19/2019.     No Known Allergies  Review of Systems  Constitutional: Negative for activity change, appetite change, chills, diaphoresis, fatigue, fever and unexpected weight change.  HENT: Negative.   Eyes: Negative.  Negative for photophobia and visual disturbance.  Respiratory: Negative for cough, chest tightness and shortness of breath.   Cardiovascular: Negative for chest pain, palpitations and leg swelling.  Gastrointestinal:  Negative for abdominal distention, abdominal pain, anal bleeding, blood in stool, constipation, diarrhea, nausea, rectal pain and vomiting.  Endocrine: Negative.  Negative for cold intolerance, heat intolerance, polydipsia, polyphagia and polyuria.  Genitourinary: Negative for decreased urine volume, difficulty urinating, dysuria, frequency and urgency.  Musculoskeletal: Positive for arthralgias (plantar fascitis) and myalgias (plantar fascitis).  Skin: Negative.   Allergic/Immunologic: Negative.   Neurological: Negative for dizziness, tremors, seizures, syncope, facial asymmetry, speech difficulty, weakness, light-headedness, numbness and headaches.  Hematological: Negative.  Does not bruise/bleed easily.  Psychiatric/Behavioral: Negative for confusion, hallucinations, sleep disturbance and suicidal ideas.  All other systems reviewed and are negative.       Objective:  BP (!) 144/78   Pulse 77   Temp 97.7 F (36.5 C)   Resp 20   Ht 5' 4.5" (1.638 m)   Wt 138 lb (62.6 kg)   SpO2 100%   BMI 23.32 kg/m    Wt Readings from Last 3 Encounters:  03/19/19 138 lb (62.6 kg)  10/30/18 138 lb (62.6 kg)  06/05/18 138 lb (62.6 kg)    Physical Exam Vitals signs and nursing note reviewed.  Constitutional:      General: She is not in acute distress.    Appearance: Normal appearance. She is well-developed and well-groomed.  She is not ill-appearing, toxic-appearing or diaphoretic.  HENT:     Head: Normocephalic and atraumatic.     Jaw: There is normal jaw occlusion.     Right Ear: Hearing, tympanic membrane, ear canal and external ear normal.     Left Ear: Hearing, tympanic membrane, ear canal and external ear normal.     Nose: Nose normal.     Mouth/Throat:     Lips: Pink.     Mouth: Mucous membranes are moist.     Pharynx: Oropharynx is clear. Uvula midline.  Eyes:     General: Lids are normal.     Extraocular Movements: Extraocular movements intact.     Conjunctiva/sclera: Conjunctivae normal.     Pupils: Pupils are equal, round, and reactive to light.  Neck:     Musculoskeletal: Normal range of motion and neck supple.     Thyroid: No thyroid mass, thyromegaly or thyroid tenderness.     Vascular: No carotid bruit or JVD.     Trachea: Trachea and phonation normal.  Cardiovascular:     Rate and Rhythm: Normal rate and regular rhythm.     Chest Wall: PMI is not displaced.     Pulses: Normal pulses.     Heart sounds: Normal heart sounds. No murmur. No friction rub. No gallop.   Pulmonary:     Effort: Pulmonary effort is normal. No respiratory distress.     Breath sounds: Normal breath sounds. No wheezing.  Abdominal:     General: Bowel sounds are normal. There is no distension or abdominal bruit.     Palpations: Abdomen is soft. There is no hepatomegaly or splenomegaly.     Tenderness: There is no abdominal tenderness. There is no right CVA tenderness or left CVA tenderness.     Hernia: No hernia is present.  Musculoskeletal: Normal range of motion.     Left ankle: Normal.     Right lower leg: No edema.     Left lower leg: No edema.     Left foot: Normal range of motion and normal capillary refill. Tenderness present. No bony tenderness, swelling, crepitus, deformity or laceration.       Feet:  Lymphadenopathy:  Cervical: No cervical adenopathy.  Skin:    General: Skin is warm and dry.      Capillary Refill: Capillary refill takes less than 2 seconds.     Coloration: Skin is not cyanotic, jaundiced or pale.     Findings: No rash.  Neurological:     General: No focal deficit present.     Mental Status: She is alert and oriented to person, place, and time.     Cranial Nerves: Cranial nerves are intact.     Sensory: Sensation is intact.     Motor: Motor function is intact.     Coordination: Coordination is intact.     Gait: Gait is intact.     Deep Tendon Reflexes: Reflexes are normal and symmetric.  Psychiatric:        Attention and Perception: Attention and perception normal.        Mood and Affect: Mood and affect normal.        Speech: Speech normal.        Behavior: Behavior normal. Behavior is cooperative.        Thought Content: Thought content normal.        Cognition and Memory: Cognition and memory normal.        Judgment: Judgment normal.     Results for orders placed or performed in visit on 07/18/18  Hepatic function panel  Result Value Ref Range   Total Protein 6.6 6.0 - 8.5 g/dL   Albumin 4.2 3.8 - 4.8 g/dL   Bilirubin Total 0.5 0.0 - 1.2 mg/dL   Bilirubin, Direct 0.10 0.00 - 0.40 mg/dL   Alkaline Phosphatase 92 39 - 117 IU/L   AST 15 0 - 40 IU/L   ALT 13 0 - 32 IU/L       Pertinent labs & imaging results that were available during my care of the patient were reviewed by me and considered in my medical decision making.  Assessment & Plan:  Cathalina was seen today for medical management of chronic issues, hypertension and hyperlipidemia.  Diagnoses and all orders for this visit:  Essential hypertension BP fairly controlled. Changes were not made in regimen today. Daily blood pressure log given with instructions on how to fill out and told to bring to next visit. Goal BP 130/80. Pt aware to report any persistent high or low readings. DASH diet and exercise encouraged. Exercise at least 150 minutes per week and increase as tolerated. Goal BMI > 25.  Stress management encouraged. Smoking cessation discussed. Avoid excessive alcohol. Avoid NSAID's. Avoid more than 2000 mg of sodium daily. Medications as prescribed. Follow up as scheduled.  -     CBC with Differential/Platelet -     CMP14+EGFR  Pure hypercholesterolemia Diet encouraged - increase intake of fresh fruits and vegetables, increase intake of lean proteins. Bake, broil, or grill foods. Avoid fried, greasy, and fatty foods. Avoid fast foods. Increase intake of fiber-rich whole grains. Exercise encouraged - at least 150 minutes per week and advance as tolerated.  Goal BMI < 25. Continue medications as prescribed. Follow up in 3-6 months as discussed.  -     CBC with Differential/Platelet -     Lipid panel -     Pitavastatin Calcium (LIVALO) 2 MG TABS; Take 1 tablet (2 mg total) by mouth daily.  Vitamin D deficiency Labs pending. Continue repletion therapy. If indicated, will change repletion dosage. Eat foods rich in Vit D including milk, orange juice, yogurt with vitamin D added, salmon  or mackerel, canned tuna fish, cereals with vitamin D added, and cod liver oil. Get out in the sun but make sure to wear at least SPF 30 sunscreen.  -     CBC with Differential/Platelet -     VITAMIN D 25 Hydroxy (Vit-D Deficiency, Fractures)  Osteopenia of multiple sites Discussed weight bearing exercises and need for calcium supplements daily. DEXA ordered. Information on osteopenia provided.  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 Epi pen is expiring. Will refill today. Pt aware if Epi pen is used she must go to the ED.  -     EPINEPHrine 0.3 mg/0.3 mL IJ SOAJ injection; Inject 0.3 mLs (0.3 mg total) into the muscle as needed (for anaphylactic / allergic reaction).  Plantar fasciitis of left foot Followed by ortho / podiatry. Recently received orthotics and this has been beneficial.    Encounter for screening for osteoporosis Discussed weight bearing  exercises and calcium supplementation. Last DEXA in 2018, will order today.  -     DG Bone Density; Future     Continue all other maintenance medications.  Follow up plan: Return in about 6 months (around 09/16/2019), or if symptoms worsen or fail to improve, for HTN.  Continue healthy lifestyle choices, including diet (rich in fruits, vegetables, and lean proteins, and low in salt and simple carbohydrates) and exercise (at least 30 minutes of moderate physical activity daily).  Educational handout given for osteopenia   The above assessment and management plan was discussed with the patient. The patient verbalized understanding of and has agreed to the management plan. Patient is aware to call the clinic if they develop any new symptoms or if symptoms persist or worsen. Patient is aware when to return to the clinic for a follow-up visit. Patient educated on when it is appropriate to go to the emergency department.   Monia Pouch, FNP-C [facility] [phone]

## 2019-03-20 LAB — CMP14+EGFR
ALT: 14 IU/L (ref 0–32)
AST: 16 IU/L (ref 0–40)
Albumin/Globulin Ratio: 2.3 — ABNORMAL HIGH (ref 1.2–2.2)
Albumin: 4.6 g/dL (ref 3.8–4.8)
Alkaline Phosphatase: 91 IU/L (ref 39–117)
BUN/Creatinine Ratio: 19 (ref 12–28)
BUN: 14 mg/dL (ref 8–27)
Bilirubin Total: 0.4 mg/dL (ref 0.0–1.2)
CO2: 24 mmol/L (ref 20–29)
Calcium: 9.8 mg/dL (ref 8.7–10.3)
Chloride: 104 mmol/L (ref 96–106)
Creatinine, Ser: 0.75 mg/dL (ref 0.57–1.00)
GFR calc Af Amer: 96 mL/min/{1.73_m2} (ref >59)
GFR calc non Af Amer: 83 mL/min/{1.73_m2} (ref >59)
Globulin, Total: 2 g/dL (ref 1.5–4.5)
Glucose: 90 mg/dL (ref 65–99)
Potassium: 4.2 mmol/L (ref 3.5–5.2)
Sodium: 142 mmol/L (ref 134–144)
Total Protein: 6.6 g/dL (ref 6.0–8.5)

## 2019-03-20 LAB — CBC WITH DIFFERENTIAL/PLATELET
Basophils Absolute: 0.1 10*3/uL (ref 0.0–0.2)
Basos: 1 %
EOS (ABSOLUTE): 0.2 10*3/uL (ref 0.0–0.4)
Eos: 3 %
Hematocrit: 41.1 % (ref 34.0–46.6)
Hemoglobin: 13.8 g/dL (ref 11.1–15.9)
Immature Grans (Abs): 0 10*3/uL (ref 0.0–0.1)
Immature Granulocytes: 0 %
Lymphocytes Absolute: 2.2 10*3/uL (ref 0.7–3.1)
Lymphs: 28 %
MCH: 29.9 pg (ref 26.6–33.0)
MCHC: 33.6 g/dL (ref 31.5–35.7)
MCV: 89 fL (ref 79–97)
Monocytes Absolute: 0.4 10*3/uL (ref 0.1–0.9)
Monocytes: 6 %
Neutrophils Absolute: 4.9 10*3/uL (ref 1.4–7.0)
Neutrophils: 62 %
Platelets: 331 10*3/uL (ref 150–450)
RBC: 4.62 x10E6/uL (ref 3.77–5.28)
RDW: 12.8 % (ref 11.7–15.4)
WBC: 7.9 10*3/uL (ref 3.4–10.8)

## 2019-03-20 LAB — LIPID PANEL
Chol/HDL Ratio: 4 ratio (ref 0.0–4.4)
Cholesterol, Total: 203 mg/dL — ABNORMAL HIGH (ref 100–199)
HDL: 51 mg/dL (ref >39)
LDL Chol Calc (NIH): 128 mg/dL — ABNORMAL HIGH (ref 0–99)
Triglycerides: 136 mg/dL (ref 0–149)
VLDL Cholesterol Cal: 24 mg/dL (ref 5–40)

## 2019-03-20 LAB — VITAMIN D 25 HYDROXY (VIT D DEFICIENCY, FRACTURES): Vit D, 25-Hydroxy: 52.6 ng/mL (ref 30.0–100.0)

## 2019-07-11 ENCOUNTER — Other Ambulatory Visit: Payer: Self-pay

## 2019-07-11 ENCOUNTER — Ambulatory Visit: Payer: Medicare PPO | Attending: Internal Medicine

## 2019-07-11 DIAGNOSIS — Z20822 Contact with and (suspected) exposure to covid-19: Secondary | ICD-10-CM

## 2019-07-12 LAB — NOVEL CORONAVIRUS, NAA: SARS-CoV-2, NAA: NOT DETECTED

## 2019-07-30 ENCOUNTER — Other Ambulatory Visit: Payer: Self-pay | Admitting: Family Medicine

## 2019-09-16 ENCOUNTER — Encounter: Payer: Self-pay | Admitting: Family Medicine

## 2019-09-16 ENCOUNTER — Ambulatory Visit (INDEPENDENT_AMBULATORY_CARE_PROVIDER_SITE_OTHER): Payer: Medicare PPO

## 2019-09-16 ENCOUNTER — Other Ambulatory Visit: Payer: Self-pay

## 2019-09-16 ENCOUNTER — Ambulatory Visit (INDEPENDENT_AMBULATORY_CARE_PROVIDER_SITE_OTHER): Payer: Medicare PPO | Admitting: Family Medicine

## 2019-09-16 VITALS — BP 153/80 | HR 79 | Temp 98.7°F | Ht 64.5 in | Wt 142.8 lb

## 2019-09-16 DIAGNOSIS — I10 Essential (primary) hypertension: Secondary | ICD-10-CM | POA: Diagnosis not present

## 2019-09-16 DIAGNOSIS — M8588 Other specified disorders of bone density and structure, other site: Secondary | ICD-10-CM

## 2019-09-16 DIAGNOSIS — M8589 Other specified disorders of bone density and structure, multiple sites: Secondary | ICD-10-CM

## 2019-09-16 DIAGNOSIS — Z13820 Encounter for screening for osteoporosis: Secondary | ICD-10-CM

## 2019-09-16 DIAGNOSIS — E7800 Pure hypercholesterolemia, unspecified: Secondary | ICD-10-CM

## 2019-09-16 DIAGNOSIS — E559 Vitamin D deficiency, unspecified: Secondary | ICD-10-CM

## 2019-09-16 MED ORDER — LIVALO 2 MG PO TABS: 2.0000 mg | ORAL_TABLET | Freq: Every day | ORAL | 3 refills | Status: DC

## 2019-09-16 MED ORDER — QUINAPRIL HCL 20 MG PO TABS: 20.0000 mg | ORAL_TABLET | Freq: Every day | ORAL | 3 refills | Status: DC

## 2019-09-16 MED ORDER — ROSUVASTATIN CALCIUM 5 MG PO TABS: 5.0000 mg | ORAL_TABLET | Freq: Every day | ORAL | 3 refills | Status: DC

## 2019-09-16 NOTE — Patient Instructions (Addendum)
Arthritis Arthritis means joint pain. It can also mean joint disease. A joint is a place where bones come together. There are more than 100 types of arthritis. What are the causes? This condition may be caused by:  Wear and tear of a joint. This is the most common cause.  A lot of acid in the blood, which leads to pain in the joint (gout).  Pain and swelling (inflammation) in a joint.  Infection of a joint.  Injuries in the joint.  A reaction to medicines (allergy). In some cases, the cause may not be known. What are the signs or symptoms? Symptoms of this condition include:  Redness at a joint.  Swelling at a joint.  Stiffness at a joint.  Warmth coming from the joint.  A fever.  A feeling of being sick. How is this treated? This condition may be treated with:  Treating the cause, if it is known.  Rest.  Raising (elevating) the joint.  Putting cold or hot packs on the joint.  Medicines to treat symptoms and reduce pain and swelling.  Shots of medicines (cortisone) into the joint. You may also be told to make changes in your life, such as doing exercises and losing weight. Follow these instructions at home: Medicines  Take over-the-counter and prescription medicines only as told by your doctor.  Do not take aspirin for pain if your doctor says that you may have gout. Activity  Rest your joint if your doctor tells you to.  Avoid activities that make the pain worse.  Exercise your joint regularly as told by your doctor. Try doing exercises like: ? Swimming. ? Water aerobics. ? Biking. ? Walking. Managing pain, stiffness, and swelling      If told, put ice on the affected area. ? Put ice in a plastic bag. ? Place a towel between your skin and the bag. ? Leave the ice on for 20 minutes, 2-3 times per day.  If your joint is swollen, raise (elevate) it above the level of your heart if told by your doctor.  If your joint feels stiff in the morning,  try taking a warm shower.  If told, put heat on the affected area. Do this as often as told by your doctor. Use the heat source that your doctor recommends, such as a moist heat pack or a heating pad. If you have diabetes, do not apply heat without asking your doctor. To apply heat: ? Place a towel between your skin and the heat source. ? Leave the heat on for 20-30 minutes. ? Remove the heat if your skin turns bright red. This is very important if you are unable to feel pain, heat, or cold. You may have a greater risk of getting burned. General instructions  Do not use any products that contain nicotine or tobacco, such as cigarettes, e-cigarettes, and chewing tobacco. If you need help quitting, ask your doctor.  Keep all follow-up visits as told by your doctor. This is important. Contact a doctor if:  The pain gets worse.  You have a fever. Get help right away if:  You have very bad pain in your joint.  You have swelling in your joint.  Your joint is red.  Many joints become painful and swollen.  You have very bad back pain.  Your leg is very weak.  You cannot control your pee (urine) or poop (stool). Summary  Arthritis means joint pain. It can also mean joint disease. A joint is a place   where bones come together.  The most common cause of this condition is wear and tear of a joint.  Symptoms of this condition include redness, swelling, or stiffness of the joint.  This condition is treated with rest, raising the joint, medicines, and putting cold or hot packs on the joint.  Follow your doctor's instructions about medicines, activity, exercises, and other home care treatments. This information is not intended to replace advice given to you by your health care provider. Make sure you discuss any questions you have with your health care provider. Document Revised: 05/14/2018 Document Reviewed: 05/14/2018 Elsevier Patient Education  2020 [email or web address]

## 2019-09-16 NOTE — Progress Notes (Signed)
Subjective:  Patient ID: Candice Beck, female    DOB: 1950-12-05, 69 y.o.   MRN: [id number]  Patient Care Team: Baruch Gouty, FNP as PCP - General (Family Medicine) Garald Balding, MD as Consulting Physician (Orthopedic Surgery)   Chief Complaint:  Medical Management of Chronic Issues and Hypertension   HPI: Candice Beck is a 69 y.o. female presenting on 09/16/2019 for Medical Management of Chronic Issues and Hypertension   1. Essential hypertension Pt reports compliance with BP medications and diet. Stays active on a regular basis. States she just took her medications prior to arriving today. Normal readings at home. No chest pain, leg swelling, weakness, confusion, headaches, or palpitations.   2. Pure hypercholesterolemia Taking Livalo due to myalgias with Lipitor. States the Livalo is expensive and she would like to trial something else if possible. Did do ok on Crestor in the past. Has never tried three times weekly dosing.   3. Osteopenia of multiple sites Does not take calcium on a daily basis. Does take Vit D repletion daily. Is active on a daily basis. No recent falls or fractures.   4. Vitamin D deficiency On repletion therapy daily. No arthralgias or myalgias. No weakness or fatigue. No recent fractures.      Relevant past medical, surgical, family, and social history reviewed and updated as indicated.  Allergies and medications reviewed and updated. Date reviewed: Chart in Epic.   Past Medical History:  Diagnosis Date  . Hyperlipidemia   . Hypertension   . Vitamin D deficiency     Past Surgical History:  Procedure Laterality Date  . TONSILLECTOMY      Social History   Socioeconomic History  . Marital status: Married    Spouse name: Not on file  . Number of children: 2  . Years of education: 63  . Highest education level: Not on file  Occupational History  . Occupation: Retired    Fish farm manager: [employer]    Comment: Book  Water engineer  Tobacco Use  . Smoking status: Never Smoker  . Smokeless tobacco: Never Used  Substance and Sexual Activity  . Alcohol use: Yes  . Drug use: No  . Sexual activity: Yes  Other Topics Concern  . Not on file  Social History Narrative  . Not on file   Social Determinants of Health   Financial Resource Strain: Low Risk   . Difficulty of Paying Living Expenses: Not hard at all  Food Insecurity: No Food Insecurity  . Worried About Charity fundraiser in the Last Year: Never true  . Ran Out of Food in the Last Year: Never true  Transportation Needs: No Transportation Needs  . Lack of Transportation (Medical): No  . Lack of Transportation (Non-Medical): No  Physical Activity: Inactive  . Days of Exercise per Week: 0 days  . Minutes of Exercise per Session: 0 min  Stress: No Stress Concern Present  . Feeling of Stress : Not at all  Social Connections: Not Isolated  . Frequency of Communication with Friends and Family: More than three times a week  . Frequency of Social Gatherings with Friends and Family: More than three times a week  . Attends Religious Services: More than 4 times per year  . Active Member of Clubs or Organizations: Yes  . Attends Archivist Meetings: More than 4 times per year  . Marital Status: Married  Human resources officer Violence: Not At Risk  .  Fear of Current or Ex-Partner: No  . Emotionally Abused: No  . Physically Abused: No  . Sexually Abused: No    Outpatient Encounter Medications as of 09/16/2019  Medication Sig  . cholecalciferol (VITAMIN D) 1000 UNITS tablet Take 5,000 Units by mouth daily.   . Coenzyme Q10 (CO Q 10 PO) Take 1 tablet by mouth daily.  Marland Kitchen EPINEPHrine 0.3 mg/0.3 mL IJ SOAJ injection Inject 0.3 mLs (0.3 mg total) into the muscle as needed (for anaphylactic / allergic reaction).  . mometasone (ELOCON) 0.1 % cream Apply 1 application topically daily.  . quinapril (ACCUPRIL) 20 MG tablet Take 1 tablet (20 mg  total) by mouth daily.  . valACYclovir (VALTREX) 1000 MG tablet Take 1 tablet (1,000 mg total) by mouth daily as needed.  . [DISCONTINUED] Pitavastatin Calcium (LIVALO) 2 MG TABS Take 1 tablet (2 mg total) by mouth daily.  . [DISCONTINUED] Pitavastatin Calcium (LIVALO) 2 MG TABS Take 1 tablet (2 mg total) by mouth daily.  . [DISCONTINUED] quinapril (ACCUPRIL) 20 MG tablet TAKE 1 TABLET BY MOUTH EVERY DAY  . rosuvastatin (CRESTOR) 5 MG tablet Take 1 tablet (5 mg total) by mouth daily.   No facility-administered encounter medications on file as of 09/16/2019.    No Known Allergies  Review of Systems  Constitutional: Negative for activity change, appetite change, chills, diaphoresis, fatigue, fever and unexpected weight change.  HENT: Negative.   Eyes: Negative.  Negative for photophobia and visual disturbance.  Respiratory: Negative for cough, chest tightness and shortness of breath.   Cardiovascular: Negative for chest pain, palpitations and leg swelling.  Gastrointestinal: Negative for abdominal pain, blood in stool, constipation, diarrhea, nausea and vomiting.  Endocrine: Negative.  Negative for cold intolerance, heat intolerance, polydipsia, polyphagia and polyuria.  Genitourinary: Negative for decreased urine volume, difficulty urinating, dysuria, frequency and urgency.  Musculoskeletal: Positive for arthralgias (right index finger and thumb). Negative for myalgias.  Skin: Negative.   Allergic/Immunologic: Negative.   Neurological: Negative for dizziness, tremors, seizures, syncope, facial asymmetry, speech difficulty, weakness, light-headedness, numbness and headaches.  Hematological: Negative.   Psychiatric/Behavioral: Negative for confusion, hallucinations, sleep disturbance and suicidal ideas.  All other systems reviewed and are negative.       Objective:  BP (!) 153/80   Pulse 79   Temp 98.7 F (37.1 C)   Ht 5' 4.5" (1.638 m)   Wt 142 lb 12.8 oz (64.8 kg)   SpO2 99%    BMI 24.13 kg/m    Wt Readings from Last 3 Encounters:  09/16/19 142 lb 12.8 oz (64.8 kg)  03/19/19 138 lb (62.6 kg)  10/30/18 138 lb (62.6 kg)    Physical Exam Vitals and nursing note reviewed.  Constitutional:      General: She is not in acute distress.    Appearance: Normal appearance. She is well-developed, well-groomed and normal weight. She is not ill-appearing, toxic-appearing or diaphoretic.  HENT:     Head: Normocephalic and atraumatic.     Jaw: There is normal jaw occlusion.     Right Ear: Hearing, tympanic membrane, ear canal and external ear normal.     Left Ear: Hearing, tympanic membrane, ear canal and external ear normal.     Nose: Nose normal.     Mouth/Throat:     Lips: Pink.     Mouth: Mucous membranes are moist.     Pharynx: Oropharynx is clear. Uvula midline.  Eyes:     General: Lids are normal.     Extraocular Movements:  Extraocular movements intact.     Conjunctiva/sclera: Conjunctivae normal.     Pupils: Pupils are equal, round, and reactive to light.  Neck:     Thyroid: No thyroid mass, thyromegaly or thyroid tenderness.     Vascular: No carotid bruit or JVD.     Trachea: Trachea and phonation normal.  Cardiovascular:     Rate and Rhythm: Normal rate and regular rhythm.     Chest Wall: PMI is not displaced.     Pulses: Normal pulses.     Heart sounds: Normal heart sounds. No murmur. No friction rub. No gallop.   Pulmonary:     Effort: Pulmonary effort is normal. No respiratory distress.     Breath sounds: Normal breath sounds. No wheezing.  Abdominal:     General: Bowel sounds are normal. There is no distension or abdominal bruit.     Palpations: Abdomen is soft. There is no hepatomegaly or splenomegaly.     Tenderness: There is no abdominal tenderness. There is no right CVA tenderness or left CVA tenderness.     Hernia: No hernia is present.  Musculoskeletal:     Right wrist: Normal.     Right hand: No swelling, deformity, lacerations,  tenderness or bony tenderness. Decreased range of motion (right index finger). Normal strength. Normal sensation. There is no disruption of two-point discrimination. Normal capillary refill. Normal pulse.     Cervical back: Normal range of motion and neck supple.     Right lower leg: No edema.     Left lower leg: No edema.  Lymphadenopathy:     Cervical: No cervical adenopathy.  Skin:    General: Skin is warm and dry.     Capillary Refill: Capillary refill takes less than 2 seconds.     Coloration: Skin is not cyanotic, jaundiced or pale.     Findings: No rash.  Neurological:     General: No focal deficit present.     Mental Status: She is alert and oriented to person, place, and time.     Cranial Nerves: Cranial nerves are intact. No cranial nerve deficit.     Sensory: Sensation is intact. No sensory deficit.     Motor: Motor function is intact. No weakness.     Coordination: Coordination is intact. Coordination normal.     Gait: Gait is intact. Gait normal.     Deep Tendon Reflexes: Reflexes are normal and symmetric. Reflexes normal.  Psychiatric:        Attention and Perception: Attention and perception normal.        Mood and Affect: Mood and affect normal.        Speech: Speech normal.        Behavior: Behavior normal. Behavior is cooperative.        Thought Content: Thought content normal.        Cognition and Memory: Cognition and memory normal.        Judgment: Judgment normal.     Results for orders placed or performed in visit on 07/11/19  Novel Coronavirus, NAA ([facility])   Specimen: Nasopharyngeal(NP) swabs in vial transport medium   NASOPHARYNGE  TESTING  Result Value Ref Range   SARS-CoV-2, NAA Not Detected Not Detected       Pertinent labs & imaging results that were available during my care of the patient were reviewed by me and considered in my medical decision making.  Assessment & Plan:  Sinclair was seen today for medical management of chronic issues and  hypertension.  Diagnoses and all orders for this visit:  Essential hypertension BP fairly controlled. Changes were not made in regimen today, pt had just taken medications prior to appointment. Goal BP is 130/80. Pt aware to report any persistent high or low readings. DASH diet and exercise encouraged. Exercise at least 150 minutes per week and increase as tolerated. Goal BMI > 25. Stress management encouraged. Avoid nicotine and tobacco product use. Avoid excessive alcohol and NSAID's. Avoid more than 2000 mg of sodium daily. Medications as prescribed. Follow up as scheduled.  -     CBC with Differential/Platelet -     CMP14+EGFR -     Lipid panel -     Thyroid Panel With TSH -     quinapril (ACCUPRIL) 20 MG tablet; Take 1 tablet (20 mg total) by mouth daily.  Pure hypercholesterolemia Has been taking Livalo which is expensive. Will trial three times weekly Crestor. Pt aware to report any myalgias.  -     Lipid panel -     rosuvastatin (CRESTOR) 5 MG tablet; Take 1 tablet (5 mg total) by mouth daily.  Osteopenia of multiple sites DEXA today. Will discuss treatment once resulted. Pt encouraged to take calcium daily with Vit D. Weight bearing exercises. -     CMP14+EGFR  Vitamin D deficiency Labs pending. Continue repletion therapy. If indicated, will change repletion dosage. Eat foods rich in Vit D including milk, orange juice, yogurt with vitamin D added, salmon or mackerel, canned tuna fish, cereals with vitamin D added, and cod liver oil. Get out in the sun but make sure to wear at least SPF 30 sunscreen.  -     VITAMIN D 25 Hydroxy (Vit-D Deficiency, Fractures)     Continue all other maintenance medications.  Follow up plan: Return in about 6 months (around 03/18/2020), or if symptoms worsen or fail to improve.   Medical decision-making:  30 minutes spent today reviewing the medical chart, counseling the patient/family, and documenting today's visit.  Continue healthy lifestyle  choices, including diet (rich in fruits, vegetables, and lean proteins, and low in salt and simple carbohydrates) and exercise (at least 30 minutes of moderate physical activity daily).  Educational handout given for arthritis  The above assessment and management plan was discussed with the patient. The patient verbalized understanding of and has agreed to the management plan. Patient is aware to call the clinic if they develop any new symptoms or if symptoms persist or worsen. Patient is aware when to return to the clinic for a follow-up visit. Patient educated on when it is appropriate to go to the emergency department.   Monia Pouch, FNP-C [facility] [phone]

## 2019-09-17 ENCOUNTER — Telehealth: Payer: Self-pay | Admitting: Family Medicine

## 2019-09-17 LAB — CBC WITH DIFFERENTIAL/PLATELET
Basophils Absolute: 0.1 10*3/uL (ref 0.0–0.2)
Basos: 1 %
EOS (ABSOLUTE): 0.2 10*3/uL (ref 0.0–0.4)
Eos: 3 %
Hematocrit: 40.3 % (ref 34.0–46.6)
Hemoglobin: 13.6 g/dL (ref 11.1–15.9)
Immature Grans (Abs): 0 10*3/uL (ref 0.0–0.1)
Immature Granulocytes: 0 %
Lymphocytes Absolute: 2.1 10*3/uL (ref 0.7–3.1)
Lymphs: 31 %
MCH: 29.7 pg (ref 26.6–33.0)
MCHC: 33.7 g/dL (ref 31.5–35.7)
MCV: 88 fL (ref 79–97)
Monocytes Absolute: 0.4 10*3/uL (ref 0.1–0.9)
Monocytes: 6 %
Neutrophils Absolute: 4 10*3/uL (ref 1.4–7.0)
Neutrophils: 59 %
Platelets: 307 10*3/uL (ref 150–450)
RBC: 4.58 x10E6/uL (ref 3.77–5.28)
RDW: 13.2 % (ref 11.7–15.4)
WBC: 6.7 10*3/uL (ref 3.4–10.8)

## 2019-09-17 LAB — VITAMIN D 25 HYDROXY (VIT D DEFICIENCY, FRACTURES): Vit D, 25-Hydroxy: 50.3 ng/mL (ref 30.0–100.0)

## 2019-09-17 LAB — CMP14+EGFR
ALT: 11 IU/L (ref 0–32)
AST: 16 IU/L (ref 0–40)
Albumin/Globulin Ratio: 2.1 (ref 1.2–2.2)
Albumin: 4.5 g/dL (ref 3.8–4.8)
Alkaline Phosphatase: 92 IU/L (ref 39–117)
BUN/Creatinine Ratio: 14 (ref 12–28)
BUN: 11 mg/dL (ref 8–27)
Bilirubin Total: 0.4 mg/dL (ref 0.0–1.2)
CO2: 23 mmol/L (ref 20–29)
Calcium: 9.6 mg/dL (ref 8.7–10.3)
Chloride: 105 mmol/L (ref 96–106)
Creatinine, Ser: 0.77 mg/dL (ref 0.57–1.00)
GFR calc Af Amer: 93 mL/min/{1.73_m2} (ref >59)
GFR calc non Af Amer: 81 mL/min/{1.73_m2} (ref >59)
Globulin, Total: 2.1 g/dL (ref 1.5–4.5)
Glucose: 93 mg/dL (ref 65–99)
Potassium: 3.8 mmol/L (ref 3.5–5.2)
Sodium: 143 mmol/L (ref 134–144)
Total Protein: 6.6 g/dL (ref 6.0–8.5)

## 2019-09-17 LAB — THYROID PANEL WITH TSH
Free Thyroxine Index: 1.7 (ref 1.2–4.9)
T3 Uptake Ratio: 26 % (ref 24–39)
T4, Total: 6.6 ug/dL (ref 4.5–12.0)
TSH: 2.57 u[IU]/mL (ref 0.450–4.500)

## 2019-09-17 LAB — LIPID PANEL
Chol/HDL Ratio: 4 ratio (ref 0.0–4.4)
Cholesterol, Total: 185 mg/dL (ref 100–199)
HDL: 46 mg/dL (ref >39)
LDL Chol Calc (NIH): 117 mg/dL — ABNORMAL HIGH (ref 0–99)
Triglycerides: 123 mg/dL (ref 0–149)
VLDL Cholesterol Cal: 22 mg/dL (ref 5–40)

## 2019-09-17 NOTE — Telephone Encounter (Signed)
Pt returned missed call from [place] regarding lab results. Wants to be called back.

## 2019-09-17 NOTE — Telephone Encounter (Signed)
Patient aware she will wait for [facility] to review labs

## 2019-09-17 NOTE — Progress Notes (Signed)
CBC normal. Thyroid function normal.  Liver function, kidney function, and glucose normal.  Vit D normal. LDL slightly elevated at 117. Can trial the Crestor three times weekly or take Red Yeast Rice 2400 mg daily if unable to tolerate the Crestor. Limit intake of fried, greasy, fatty, and fast foods. Recheck in 6-9 months.

## 2019-09-19 ENCOUNTER — Other Ambulatory Visit: Payer: Medicare Other

## 2019-09-19 ENCOUNTER — Ambulatory Visit: Payer: Medicare Other | Admitting: Family Medicine

## 2019-09-24 ENCOUNTER — Other Ambulatory Visit: Payer: Self-pay | Admitting: Family Medicine

## 2019-11-06 ENCOUNTER — Other Ambulatory Visit: Payer: Self-pay | Admitting: Family Medicine

## 2019-11-06 DIAGNOSIS — I10 Essential (primary) hypertension: Secondary | ICD-10-CM

## 2020-01-15 ENCOUNTER — Other Ambulatory Visit: Payer: Self-pay | Admitting: *Deleted

## 2020-01-15 MED ORDER — VALACYCLOVIR HCL 1 G PO TABS: 1000.0000 mg | ORAL_TABLET | Freq: Every day | ORAL | 2 refills | Status: DC | PRN

## 2020-02-29 ENCOUNTER — Other Ambulatory Visit: Payer: Self-pay | Admitting: Family Medicine

## 2020-02-29 DIAGNOSIS — I10 Essential (primary) hypertension: Secondary | ICD-10-CM

## 2020-03-17 ENCOUNTER — Other Ambulatory Visit: Payer: Self-pay

## 2020-03-17 ENCOUNTER — Other Ambulatory Visit: Payer: Medicare PPO

## 2020-03-17 ENCOUNTER — Other Ambulatory Visit: Payer: Self-pay | Admitting: Family Medicine

## 2020-03-17 DIAGNOSIS — E7800 Pure hypercholesterolemia, unspecified: Secondary | ICD-10-CM

## 2020-03-17 LAB — CMP14+EGFR
ALT: 15 IU/L (ref 0–32)
AST: 15 IU/L (ref 0–40)
Albumin/Globulin Ratio: 1.7 (ref 1.2–2.2)
Albumin: 4.4 g/dL (ref 3.8–4.8)
Alkaline Phosphatase: 100 IU/L (ref 44–121)
BUN/Creatinine Ratio: 17 (ref 12–28)
BUN: 13 mg/dL (ref 8–27)
Bilirubin Total: 0.4 mg/dL (ref 0.0–1.2)
CO2: 27 mmol/L (ref 20–29)
Calcium: 10.1 mg/dL (ref 8.7–10.3)
Chloride: 103 mmol/L (ref 96–106)
Creatinine, Ser: 0.75 mg/dL (ref 0.57–1.00)
GFR calc Af Amer: 95 mL/min/{1.73_m2} (ref >59)
GFR calc non Af Amer: 83 mL/min/{1.73_m2} (ref >59)
Globulin, Total: 2.6 g/dL (ref 1.5–4.5)
Glucose: 90 mg/dL (ref 65–99)
Potassium: 3.8 mmol/L (ref 3.5–5.2)
Sodium: 144 mmol/L (ref 134–144)
Total Protein: 7 g/dL (ref 6.0–8.5)

## 2020-03-17 LAB — LIPID PANEL
Chol/HDL Ratio: 6.5 ratio — ABNORMAL HIGH (ref 0.0–4.4)
Cholesterol, Total: 268 mg/dL — ABNORMAL HIGH (ref 100–199)
HDL: 41 mg/dL (ref >39)
LDL Chol Calc (NIH): 177 mg/dL — ABNORMAL HIGH (ref 0–99)
Triglycerides: 260 mg/dL — ABNORMAL HIGH (ref 0–149)
VLDL Cholesterol Cal: 50 mg/dL — ABNORMAL HIGH (ref 5–40)

## 2020-03-18 ENCOUNTER — Ambulatory Visit (INDEPENDENT_AMBULATORY_CARE_PROVIDER_SITE_OTHER): Payer: Medicare PPO | Admitting: Family Medicine

## 2020-03-18 ENCOUNTER — Encounter: Payer: Self-pay | Admitting: Family Medicine

## 2020-03-18 VITALS — BP 136/74 | HR 95 | Temp 97.9°F | Ht 64.5 in | Wt 139.6 lb

## 2020-03-18 DIAGNOSIS — G5761 Lesion of plantar nerve, right lower limb: Secondary | ICD-10-CM

## 2020-03-18 DIAGNOSIS — E7800 Pure hypercholesterolemia, unspecified: Secondary | ICD-10-CM | POA: Diagnosis not present

## 2020-03-18 DIAGNOSIS — M7741 Metatarsalgia, right foot: Secondary | ICD-10-CM

## 2020-03-18 DIAGNOSIS — M7742 Metatarsalgia, left foot: Secondary | ICD-10-CM

## 2020-03-18 DIAGNOSIS — K2100 Gastro-esophageal reflux disease with esophagitis, without bleeding: Secondary | ICD-10-CM

## 2020-03-18 DIAGNOSIS — I10 Essential (primary) hypertension: Secondary | ICD-10-CM

## 2020-03-18 DIAGNOSIS — B001 Herpesviral vesicular dermatitis: Secondary | ICD-10-CM

## 2020-03-18 DIAGNOSIS — Z7689 Persons encountering health services in other specified circumstances: Secondary | ICD-10-CM | POA: Diagnosis not present

## 2020-03-18 MED ORDER — LIVALO 4 MG PO TABS: 1.0000 | ORAL_TABLET | Freq: Every day | ORAL | 3 refills | Status: DC

## 2020-03-18 MED ORDER — VALACYCLOVIR HCL 1 G PO TABS: 2000.0000 mg | ORAL_TABLET | Freq: Two times a day (BID) | ORAL | 0 refills | Status: DC

## 2020-03-18 MED ORDER — OMEPRAZOLE 20 MG PO CPDR: 20.0000 mg | DELAYED_RELEASE_CAPSULE | Freq: Every day | ORAL | 3 refills | Status: DC

## 2020-03-18 NOTE — Progress Notes (Signed)
Subjective: CC: est care, hyperlipidemia, HSV 1, foot pain PCP: No primary care provider on file. XMD:YJWLK R Facer is a 69 y.o. female presenting to clinic today for:  1. Hypertension with hyperlipidemia/GERD Compliant with Accupril 20 mg daily. Patient was on Crestor but she notes that she was having quite a bit of knee pain and therefore she discontinued it. Prior to that she was also treated with Lipitor, again had difficulty with joint pain. She has been on Livalo 2 mg and this was the most tolerable of all the medications. However, it was quite expensive and she was subsequently switched to Crestor. She maintains a healthy lifestyle and tries to stay physically active. No chest pain, shortness of breath. She does report occasional acid reflux such that it will reflux into her throat. She often will take an Alka-Seltzer for this or will drink milk. She is not on any PPI. No hematochezia, melena. Bowel movements are normal.  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 This is relieved by Valtrex. She does need a refill on this. No history of HSV-2  3. Foot pain Patient reports history of heel spur. She saw Dr. Durward Fortes and was placed in customized shoes. The heel pain has resolved but now she has forefoot pain if she does not wear these prescribed shoes. Denies any sensory changes.  ROS: Per HPI  No Known Allergies Past Medical History:  Diagnosis Date  . Hyperlipidemia   . Hypertension   . Vitamin D deficiency     Current Outpatient Medications:  .  cholecalciferol (VITAMIN D) 1000 UNITS tablet, Take 5,000 Units by mouth daily. , Disp: , Rfl:  .  Coenzyme Q10 (CO Q 10 PO), Take 1 tablet by mouth daily., Disp: , Rfl:  .  EPINEPHrine 0.3 mg/0.3 mL IJ SOAJ injection, Inject 0.3 mLs (0.3 mg total) into the muscle as needed (for anaphylactic / allergic reaction)., Disp: 1 each, Rfl: 3 .  mometasone (ELOCON) 0.1 % cream, Apply 1 application topically daily., Disp: 45 g, Rfl:  3 .  quinapril (ACCUPRIL) 20 MG tablet, TAKE 1 TABLET BY MOUTH EVERY DAY, Disp: 90 tablet, Rfl: 0 .  rosuvastatin (CRESTOR) 5 MG tablet, Take 1 tablet (5 mg total) by mouth daily., Disp: 90 tablet, Rfl: 3 .  valACYclovir (VALTREX) 1000 MG tablet, Take 1 tablet (1,000 mg total) by mouth daily as needed., Disp: 30 tablet, Rfl: 2 Social History   Socioeconomic History  . Marital status: Married    Spouse name: Not on file  . Number of children: 2  . Years of education: 25  . Highest education level: Not on file  Occupational History  . Occupation: Retired    Fish farm manager: [employer]    Comment: Book Water engineer  Tobacco Use  . Smoking status: Never Smoker  . Smokeless tobacco: Never Used  Vaping Use  . Vaping Use: Never used  Substance and Sexual Activity  . Alcohol use: Yes  . Drug use: No  . Sexual activity: Yes  Other Topics Concern  . Not on file  Social History Narrative  . Not on file   Social Determinants of Health   Financial Resource Strain:   . Difficulty of Paying Living Expenses: Not on file  Food Insecurity:   . Worried About Charity fundraiser in the Last Year: Not on file  . Ran Out of Food in the Last Year: Not on file  Transportation Needs:   . Lack of Transportation (  Medical): Not on file  . Lack of Transportation (Non-Medical): Not on file  Physical Activity:   . Days of Exercise per Week: Not on file  . Minutes of Exercise per Session: Not on file  Stress:   . Feeling of Stress : Not on file  Social Connections:   . Frequency of Communication with Friends and Family: Not on file  . Frequency of Social Gatherings with Friends and Family: Not on file  . Attends Religious Services: Not on file  . Active Member of Clubs or Organizations: Not on file  . Attends Archivist Meetings: Not on file  . Marital Status: Not on file  Intimate Partner Violence:   . Fear of Current or Ex-Partner: Not on file  . Emotionally  Abused: Not on file  . Physically Abused: Not on file  . Sexually Abused: Not on file   Family History  Problem Relation Age of Onset  . Cancer Mother        breast   . Heart disease Mother        aortic valve replaced x2  . Osteoporosis Mother   . Cancer Father        throat  . Heart disease Brother        heart attack age 89  . Diabetes Brother        due to weight  . Hypertension Brother   . Hypertension Brother   . Cancer Maternal Aunt        breast    Objective: Office vital signs reviewed. BP 136/74   Pulse 95   Temp 97.9 F (36.6 C)   Ht 5' 4.5" (1.638 m)   Wt 139 lb 9.6 oz (63.3 kg)   SpO2 100%   BMI 23.59 kg/m   Physical Examination:  General: Awake, alert, well nourished, No acute distress HEENT: Normal, sclera white, MMM Cardio: regular rate and rhythm, S1S2 heard, no murmurs appreciated Pulm: clear to auscultation bilaterally, no wheezes, rhonchi or rales; normal work of breathing on room air GI: soft, non-tender, non-distended, bowel sounds present x4, no hepatomegaly, no splenomegaly, no masses Extremities: warm, well perfused, No edema, cyanosis or clubbing; +2 pulses bilaterally MSK: normal gait and station  Foot: She has tenderness palpation along the plantar surface of the forefoot between digits 2 and 3 bilaterally. She has a palpable neuroma on the right foot. She has some splaying of the toes noted bilaterally.  The 10-year ASCVD risk score Mikey Bussing DC Brooke Bonito., et al., 2013) is: 12.8%   Values used to calculate the score:     Age: 79 years     Sex: Female     Is Non-Hispanic African American: No     Diabetic: No     Tobacco smoker: No     Systolic Blood Pressure: 536 mmHg     Is BP treated: Yes     HDL Cholesterol: 41 mg/dL     Total Cholesterol: 268 mg/dL  Assessment/ Plan: 69 y.o. female   1. Pure hypercholesterolemia ASCVD risk score calculated to be 12.8% today. We discussed that this is high risk and I recommend restarting a statin. She  would like to go back to [place] and this was prescribed. Start with 2 mg daily and increase as tolerated to 4 mg daily. She will come in 3 months for repeat fasting lipid panel and liver function tests. - Pitavastatin Calcium (LIVALO) 4 MG TABS; Take 1 tablet (4 mg total) by mouth daily.  Dispense:  90 tablet; Refill: 3 - CMP14+EGFR; Future - Lipid panel; Future  2. Essential hypertension Well-controlled. Continue current regimen  3. Establishing care with new doctor, encounter for  4. Metatarsalgia of both feet Recommended metatarsal pads.  5. Morton's neuroma of right foot  6. Gastroesophageal reflux disease with esophagitis without hemorrhage Trial of PPI for 2 weeks. May use as needed after that. - omeprazole (PRILOSEC) 20 MG capsule; Take 1 capsule (20 mg total) by mouth daily. 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 If she is having more than 5 flares per year we will plan for daily suppressive therapy. I have corrected her instructions to reflect HSV-1 treatment. - valACYclovir (VALTREX) 1000 MG tablet; Take 2 tablets (2,000 mg total) by mouth 2 (two) times daily. x1 day per flare.  Dispense: 20 tablet; Refill: 0   No orders of the defined types were placed in this encounter.  No orders of the defined types were placed in this encounter.    Janora Norlander, DO [place] [phone]

## 2020-03-18 NOTE — Patient Instructions (Addendum)
Medium Metatarsal pad (can be found on [employer]) for the foot pain.  Omeprazole daily x2-4 weeks for acid reflux.  Can repeat as needed throughout the year.  Livalo restarted.  Come back for labs in 3 months.   Morton Neuralgia  Morton neuralgia is foot pain that affects the ball of the foot and the area near the toes. Morton neuralgia occurs when part of a nerve in the foot (digital nerve) is under too much pressure (compressed). When this happens over a long period of time, the nerve can thicken (neuroma) and cause pain. Pain usually occurs between the third and fourth toes.  Morton neuralgia can come and go but may get worse over time. What are the causes? This condition is caused by doing the same things over and over with your foot, such as:  Activities such as running or jumping.  Wearing shoes that are too tight. What increases the risk? You may be at higher risk for Morton neuralgia if you:  Are female.  Wear high heels.  Wear shoes that are narrow or tight.  Do activities that repeatedly stretch your toes, such as: ? Running. ? Ballet. ? Long-distance walking. What are the signs or symptoms? The first symptom of Morton neuralgia is pain that spreads from the ball of the foot to the toes. It may feel like you are walking on a marble. Pain usually gets worse with walking and goes away at night. Other symptoms may include numbness and cramping of your toes. Both feet are equally affected, but rarely at the same time. How is this diagnosed? This condition is diagnosed based on your symptoms, your medical history, and a physical exam. Your health care provider may:  Squeeze your foot just behind your toe.  Ask you to move your toes to check for pain.  Ask about your physical activity level. You also may have imaging tests, such as an X-ray, ultrasound, or MRI. How is this treated? Treatment depends on how severe your condition is and what causes it. Treatment may  involve:  Wearing different shoes that are not too tight, are low-heeled, and provide good support. For some people, this is the only treatment needed.  Wearing an over-the-counter or custom supportive pad (orthotic) under the front of your foot.  Getting injections of numbing medicine and anti-inflammatory medicine (steroid) in the nerve.  Having surgery to remove part of the thickened nerve. Follow these instructions at home: Managing pain, stiffness, and swelling   Massage your foot as needed.  Wear orthotics as told by your health care provider.  If directed, put ice on your foot: ? Put ice in a plastic bag. ? Place a towel between your skin and the bag. ? Leave the ice on for 20 minutes, 2-3 times a day.  Avoid activities that cause pain or make pain worse. If you play sports, ask your health care provider when it is safe for you to return to sports.  Raise (elevate) your foot above the level of your heart while lying down and, when possible, while sitting. General instructions  Take over-the-counter and prescription medicines only as told by your health care provider.  Do not drive or use heavy machinery while taking prescription pain medicine.  Wear shoes that: ? Have soft soles. ? Have a wide toe area. ? Provide arch support. ? Do not pinch or squeeze your feet. ? Have room for your orthotics, if applicable.  Keep all follow-up visits as told by your health care  provider. This is important. Contact a health care provider if:  Your symptoms get worse or do not get better with treatment and home care. Summary  Morton neuralgia is foot pain that affects the ball of the foot and the area near the toes. Pain usually occurs between the third and fourth toes, gets worse with walking, and goes away at night.  Morton neuralgia occurs when part of a nerve in the foot (digital nerve) is under too much pressure. When this happens over a long period of time, the nerve can  thicken (neuroma) and cause pain.  This condition is caused by doing the same things over and over with your foot, such as running or jumping, wearing shoes that are too tight, or wearing high heels.  Treatment may involve wearing low-heeled shoes that are not too tight, wearing a supportive pad (orthotic) under the front of your foot, getting injections in the nerve, or having surgery to remove part of the thickened nerve. This information is not intended to replace advice given to you by your health care provider. Make sure you discuss any questions you have with your health care provider. Document Revised: 06/20/2017 Document Reviewed: 06/20/2017 Elsevier Patient Education  2020 [employer].

## 2020-03-31 ENCOUNTER — Telehealth: Payer: Self-pay

## 2020-03-31 NOTE — Telephone Encounter (Signed)
Unfortunately, there is no patient assistance program for Medicare/Livalo.  The foundations and grants for hyperlipidemia are also closed until January 2022.  I'm happy to help with this patient in January.  Medicare is tough for lipid medication (other than statins)  Could she try 3x statin (I.e. pravastatin) to help with cost and try to reduce side effects?

## 2020-03-31 NOTE — Telephone Encounter (Signed)
If patient is willing to switch to Pravachol, I'd be agreeable to that.  Would prefer 40mg  daily.

## 2020-04-01 NOTE — Telephone Encounter (Signed)
Patient will do research and call us back

## 2020-04-02 DIAGNOSIS — R928 Other abnormal and inconclusive findings on diagnostic imaging of breast: Secondary | ICD-10-CM

## 2020-04-28 ENCOUNTER — Telehealth: Payer: Self-pay | Admitting: *Deleted

## 2020-04-28 DIAGNOSIS — E7800 Pure hypercholesterolemia, unspecified: Secondary | ICD-10-CM

## 2020-04-28 NOTE — Telephone Encounter (Signed)
PA came in today for Livalo 4 mg tab  Per plan - needs PA   Key: [id number] Sent to plan   Needed documentation of step therapy  Tried lipitor and crestor.

## 2020-04-28 NOTE — Telephone Encounter (Signed)
Approved today PA Case: [id number], Status: Approved, Coverage Starts on: 06/21/2019 12:00:00 AM, Coverage Ends on: 06/19/2021 12:00:00 AM. Questions? Contact [phone]  [facility] aware

## 2020-05-09 IMAGING — DX DG OS CALCIS 2+V*L*
2 series · 2 of 2 positions shown · non-contrast
Comparison: None.

CLINICAL DATA: 67-year-old female with heel pain. No reported
trauma. Initial encounter.

EXAM:
LEFT OS CALCIS - 2+ VIEW

[calcaneus axial]
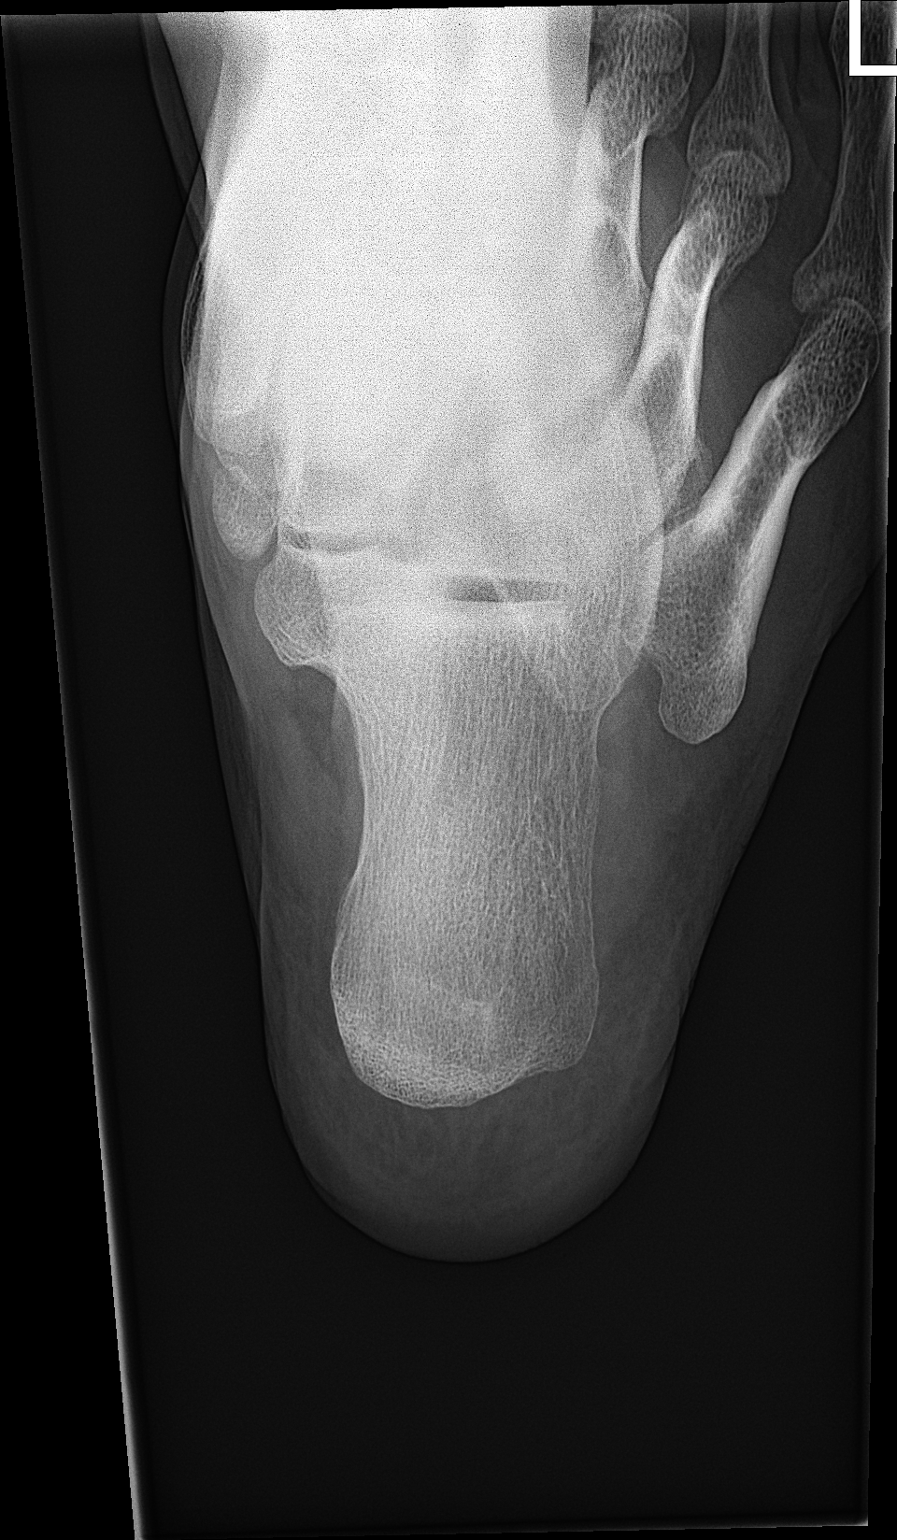

[calcaneus lat]
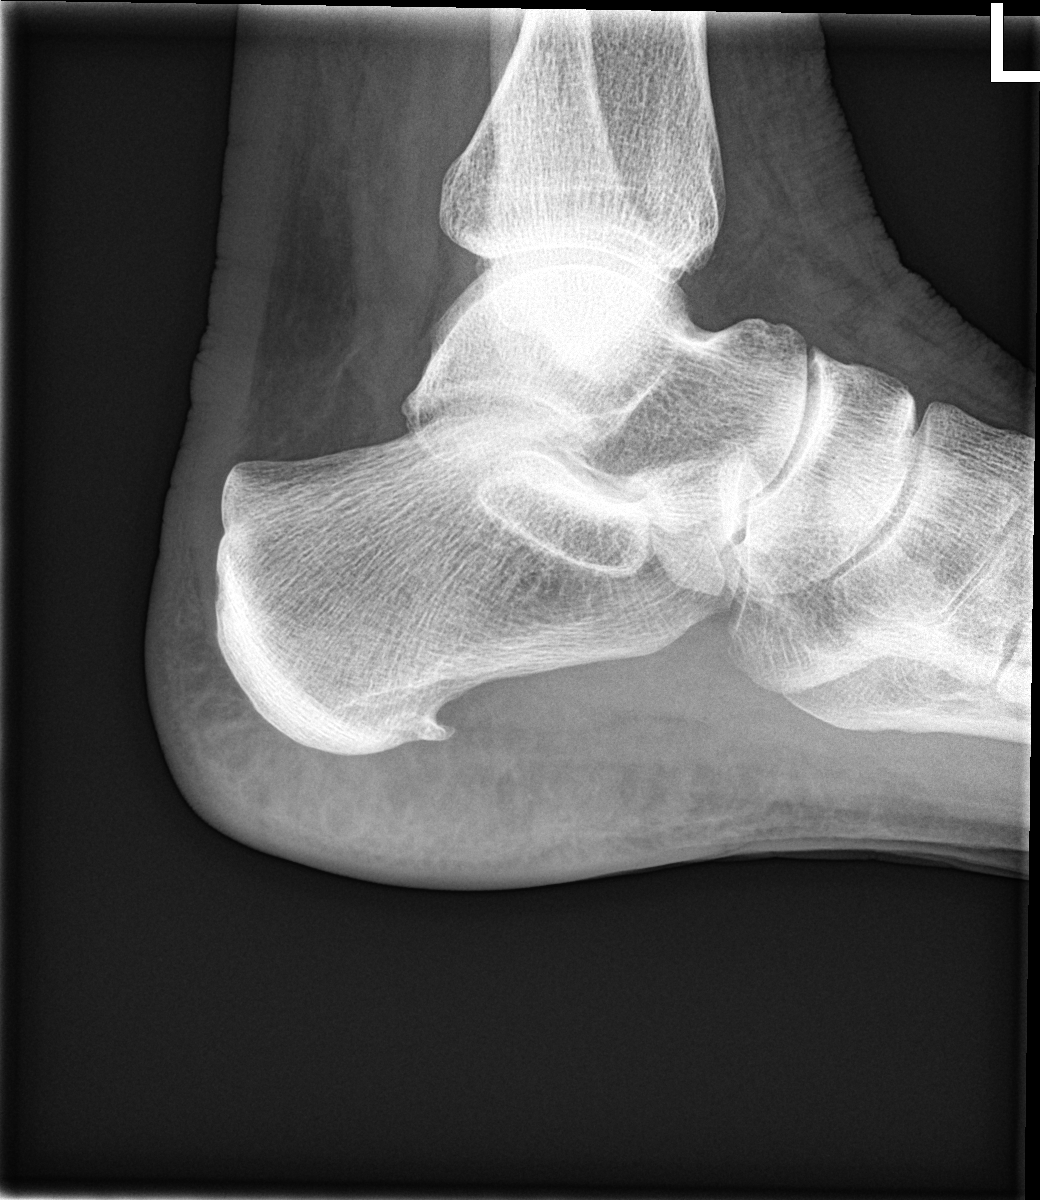

[2 of 2 positions shown; findings below may reference images not displayed]

FINDINGS: Moderate-size plantar spur. Mild prominence of heel fat pad. No
fracture or dislocation noted.
IMPRESSION: 1. Moderate-size plantar spur.
2. Mild prominence of heel fat pad.

## 2020-06-19 ENCOUNTER — Other Ambulatory Visit: Payer: Self-pay | Admitting: Family Medicine

## 2020-06-19 DIAGNOSIS — K2100 Gastro-esophageal reflux disease with esophagitis, without bleeding: Secondary | ICD-10-CM

## 2020-07-27 ENCOUNTER — Encounter: Payer: Self-pay | Admitting: Family Medicine

## 2020-07-29 ENCOUNTER — Other Ambulatory Visit: Payer: Self-pay

## 2020-07-29 ENCOUNTER — Ambulatory Visit (INDEPENDENT_AMBULATORY_CARE_PROVIDER_SITE_OTHER): Payer: Medicare PPO | Admitting: Family Medicine

## 2020-07-29 VITALS — BP 141/79 | HR 80 | Temp 97.4°F | Ht 64.0 in | Wt 148.0 lb

## 2020-07-29 DIAGNOSIS — M26621 Arthralgia of right temporomandibular joint: Secondary | ICD-10-CM

## 2020-07-29 DIAGNOSIS — M2669 Other specified disorders of temporomandibular joint: Secondary | ICD-10-CM

## 2020-07-29 NOTE — Patient Instructions (Signed)
This is the technique we used today to treat your jaw  [email or web address]   Alleve/ Motrin will be helpful for a few days. Heat alternating with ice for pain relief Night guard if you grind your teeth If symptoms persist, can refer to physical therapy for jaw or send to ENT specialist for botox injection into the joint.  Temporomandibular Joint Syndrome  Temporomandibular joint syndrome (TMJ syndrome) is a condition that causes pain in the temporomandibular joints. These joints are located near your ears and allow your jaw to open and close. For people with TMJ syndrome, chewing, biting, or other movements of the jaw can be difficult or painful. TMJ syndrome is often mild and goes away within a few weeks. However, sometimes the condition becomes a long-term (chronic) problem. What are the causes? This condition may be caused by:  Grinding your teeth or clenching your jaw. Some people do this when they are under stress.  Arthritis.  Injury to the jaw.  Head or neck injury.  Teeth or dentures that are not aligned well. In some cases, the cause of TMJ syndrome may not be known. What are the signs or symptoms? The most common symptom of this condition is an aching pain on the side of the head in the area of the TMJ. Other symptoms may include:  Pain when moving your jaw, such as when chewing or biting.  Being unable to open your jaw all the way.  Making a clicking sound when you open your mouth.  Headache.  Earache.  Neck or shoulder pain. How is this diagnosed? This condition may be diagnosed based on:  Your symptoms and medical history.  A physical exam. Your health care provider may check the range of motion of your jaw.  Imaging tests, such as X-rays or an MRI. You may also need to see your dentist, who will determine if your teeth and jaw are lined up correctly. How is this treated? TMJ syndrome often goes away on its own. If treatment is  needed, the options may include:  Eating soft foods and applying ice or heat.  Medicines to relieve pain or inflammation.  Medicines or massage to relax the muscles.  A splint, bite plate, or mouthpiece to prevent teeth grinding or jaw clenching.  Relaxation techniques or counseling to help reduce stress.  A therapy for pain in which an electrical current is applied to the nerves through the skin (transcutaneous electrical nerve stimulation).  Acupuncture. This is sometimes helpful to relieve pain.  Jaw surgery. This is rarely needed. Follow these instructions at home: Eating and drinking  Eat a soft diet if you are having trouble chewing.  Avoid foods that require a lot of chewing. Do not chew gum. General instructions  Take over-the-counter and prescription medicines only as told by your health care provider.  If directed, put ice on the painful area. ? Put ice in a plastic bag. ? Place a towel between your skin and the bag. ? Leave the ice on for 20 minutes, 2-3 times a day.  Apply a warm, wet cloth (warm compress) to the painful area as directed.  Massage your jaw area and do any jaw stretching exercises as told by your health care provider.  If you were given a splint, bite plate, or mouthpiece, wear it as told by your health care provider.  Keep all follow-up visits as told by your health care provider. This is important.   Contact a health care provider if:  You are  having trouble eating.  You have new or worsening symptoms. Get help right away if:  Your jaw locks open or closed. Summary  Temporomandibular joint syndrome (TMJ syndrome) is a condition that causes pain in the temporomandibular joints. These joints are located near your ears and allow your jaw to open and close.  TMJ syndrome is often mild and goes away within a few weeks. However, sometimes the condition becomes a long-term (chronic) problem.  Symptoms include an aching pain on the side of  the head in the area of the TMJ, pain when chewing or biting, and being unable to open your jaw all the way. You may also make a clicking sound when you open your mouth.  TMJ syndrome often goes away on its own. If treatment is needed, it may include medicines to relieve pain, reduce inflammation, or relax the muscles. A splint, bite plate, or mouthpiece may also be used to prevent teeth grinding or jaw clenching. This information is not intended to replace advice given to you by your health care provider. Make sure you discuss any questions you have with your health care provider. Document Revised: 08/18/2017 Document Reviewed: 07/18/2017 Elsevier Patient Education  2021 [employer].

## 2020-07-29 NOTE — Progress Notes (Signed)
Subjective: CC: Jaw pain PCP: Raliegh Ip, DO BOF:BPZWC Candice Beck is a 70 y.o. female presenting to clinic today for:  1. Jaw pain Patient reports onset of right-sided jaw pain radiating into her right ear shortly after getting her teeth cleaned in August 2021.  She notes that it did seem to ease but over the last 2 to 3 months has become quite severe such that she is not able to fully open her jaw without significant pain.  She not able to chew or eat certain foods due to the pain.  She is not used any medications thus far any specific treatments.  She does not have any dentures but she has been told that she grinds her teeth.  She is not currently using a nightguard.   ROS: Per HPI  No Known Allergies Past Medical History:  Diagnosis Date  . Hyperlipidemia   . Hypertension   . Vitamin D deficiency     Current Outpatient Medications:  .  cholecalciferol (VITAMIN D) 1000 UNITS tablet, Take 5,000 Units by mouth daily. , Disp: , Rfl:  .  Coenzyme Q10 (CO Q 10 PO), Take 1 tablet by mouth daily., Disp: , Rfl:  .  EPINEPHrine 0.3 mg/0.3 mL IJ SOAJ injection, Inject 0.3 mLs (0.3 mg total) into the muscle as needed (for anaphylactic / allergic reaction)., Disp: 1 each, Rfl: 3 .  Pitavastatin Calcium (LIVALO) 4 MG TABS, Take 1 tablet (4 mg total) by mouth daily., Disp: 90 tablet, Rfl: 3 .  quinapril (ACCUPRIL) 20 MG tablet, TAKE 1 TABLET BY MOUTH EVERY DAY, Disp: 90 tablet, Rfl: 0 .  valACYclovir (VALTREX) 1000 MG tablet, Take 2 tablets (2,000 mg total) by mouth 2 (two) times daily. x1 day per flare., Disp: 20 tablet, Rfl: 0 Social History   Socioeconomic History  . Marital status: Married    Spouse name: Not on file  . Number of children: 2  . Years of education: 16  . Highest education level: Not on file  Occupational History  . Occupation: Retired    Associate Professor: [employer]    Comment: Book Radio broadcast assistant  Tobacco Use  . Smoking status: Never Smoker   . Smokeless tobacco: Never Used  Vaping Use  . Vaping Use: Never used  Substance and Sexual Activity  . Alcohol use: Yes  . Drug use: No  . Sexual activity: Yes  Other Topics Concern  . Not on file  Social History Narrative  . Not on file   Social Determinants of Health   Financial Resource Strain: Not on file  Food Insecurity: Not on file  Transportation Needs: Not on file  Physical Activity: Not on file  Stress: Not on file  Social Connections: Not on file  Intimate Partner Violence: Not on file   Family History  Problem Relation Age of Onset  . Cancer Mother        breast   . Heart disease Mother        aortic valve replaced x2  . Osteoporosis Mother   . Cancer Father        throat  . Heart disease Brother        heart attack age 22  . Diabetes Brother        due to weight  . Hypertension Brother   . Hypertension Brother   . Cancer Maternal Aunt        breast    Objective: Office vital signs reviewed. BP Marland Kitchen)  141/79   Pulse 80   Temp (!) 97.4 F (36.3 C) (Temporal)   Ht 5\' 4"  (1.626 m)   Wt 148 lb (67.1 kg)   SpO2 97%   BMI 25.40 kg/m   Physical Examination:  General: Awake, alert, well nourished, No acute distress HEENT: Normal, TMs intact bilaterally and unremarkable MSK  Jaw: Asymmetric glide of jaw.  She has palpable lag of the right TMJ.  She has palpable crepitus upon opening and closing.  She is only able to open the mouth roughly 2 finger breaths without pain  Procedure: OMT for TMJ on right  Verbal consent for OMT maneuver.  Patient was placed in the supine position.  She was asked to slightly open her mouth and physicians right hand provided gentle pressure along the angle of the right jaw with counterpressure along the left side of the patient's head.  Patient was asked to move her jaw against the physician's right hand for 3 to 5 seconds and then relax.  New barrier was obtained and the above steps were repeated for more  times.  Assessment/ Plan: 70 y.o. female   Arthralgia of right temporomandibular joint  Temporomandibular jaw dysfunction  Patient was treated with OMT maneuver for TMJ on the right side.  See above note.  She did have slight improvement in the glide but there is still palpable crepitus on her exam.  For this reason of advised her to proceed with oral NSAIDs, heat alternating with ice.  She may continue the osteopathic maneuver at home if she finds this to be helpful with very gentle traction.  We discussed avoiding any severe movement of the jaw.  This should be very gentle.  I have guided her to an online video for reference.  I offered her formal physical therapy should she desire versus referral to ENT.  We discussed that Botox injections sometimes are used in severe cases.  I do not think that she needs that at this point.  I have also advised her to get a nightguard since she grinds teeth.  She can follow-up as needed on this issue  No orders of the defined types were placed in this encounter.  No orders of the defined types were placed in this encounter.    81, DO [facility] [phone]

## 2020-10-31 ENCOUNTER — Other Ambulatory Visit: Payer: Self-pay | Admitting: Family Medicine

## 2020-10-31 DIAGNOSIS — I10 Essential (primary) hypertension: Secondary | ICD-10-CM

## 2020-12-04 ENCOUNTER — Other Ambulatory Visit: Payer: Self-pay

## 2020-12-04 ENCOUNTER — Ambulatory Visit: Payer: Medicare PPO | Admitting: Family Medicine

## 2020-12-04 VITALS — BP 131/76 | HR 84 | Temp 97.3°F | Ht 64.0 in | Wt 139.8 lb

## 2020-12-04 DIAGNOSIS — E7800 Pure hypercholesterolemia, unspecified: Secondary | ICD-10-CM | POA: Diagnosis not present

## 2020-12-04 DIAGNOSIS — R634 Abnormal weight loss: Secondary | ICD-10-CM

## 2020-12-04 DIAGNOSIS — I10 Essential (primary) hypertension: Secondary | ICD-10-CM

## 2020-12-04 NOTE — Patient Instructions (Signed)

## 2020-12-04 NOTE — Progress Notes (Signed)
Subjective: CC: HLD w/ HTN PCP: Janora Norlander, DO ONG:EXBMW R [place] is a 70 y.o. female presenting to clinic today for:  1. HLD w/ HTN Compliant with Livalo 75m daily.  She is compliant with Accupril 20 mg daily Denies any chest pain, shortness of breath, abdominal pain.  No myalgia  2.  Unexplained weight loss Patient is that she is at unexplained weight loss since February.  She admits that she is a little bit more physically active now that the warmer months are present but she is really not done anything else to improve weight.  She does have a family history of cancers in both her parents and relatives.  She denies any rectal bleeding, hemoptysis, change in exercise tolerance, night sweats, vaginal bleeding.  Last colonoscopy was in 2018 and was normal.  Not expected to repeat until 2028.  Last mammogram performed in September '21 and also noted to be normal.  Last Pap smear performed in January 2019 and was normal.  She has never been a smoker but did grow up around smokers.  She has a relative that is getting genetic testing currently for a uterine cancer.  There are new diagnosis of Graves' disease in her nephew and his mother.  ROS: Per HPI  No Known Allergies Past Medical History:  Diagnosis Date   Hyperlipidemia    Hypertension    Vitamin D deficiency     Current Outpatient Medications:    cholecalciferol (VITAMIN D) 1000 UNITS tablet, Take 5,000 Units by mouth daily. , Disp: , Rfl:    Coenzyme Q10 (CO Q 10 PO), Take 1 tablet by mouth daily., Disp: , Rfl:    quinapril (ACCUPRIL) 20 MG tablet, TAKE 1 TABLET BY MOUTH EVERY DAY, Disp: 90 tablet, Rfl: 0   valACYclovir (VALTREX) 1000 MG tablet, Take 2 tablets (2,000 mg total) by mouth 2 (two) times daily. x1 day per flare., Disp: 20 tablet, Rfl: 0   EPINEPHrine 0.3 mg/0.3 mL IJ SOAJ injection, Inject 0.3 mLs (0.3 mg total) into the muscle as needed (for anaphylactic / allergic reaction). 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 Tobacco Use   Smoking status: Never   Smokeless tobacco: Never  Vaping Use   Vaping Use: Never used  Substance and Sexual Activity   Alcohol use: Yes   Drug use: No   Sexual activity: Yes  Other Topics Concern   Not on file  Social History Narrative   Not on file   Social Determinants of Health   Financial Resource Strain: Not on file  Food Insecurity: Not on file  Transportation Needs: Not on file  Physical Activity: Not on file  Stress: Not on file  Social Connections: Not on file  Intimate Partner Violence: Not on file   Family History  Problem Relation Age of Onset   Cancer Mother        breast    Heart disease Mother        aortic valve replaced x2   Osteoporosis Mother    Cancer Father        throat  Heart disease Brother        heart attack age 22   Diabetes Brother        due to weight   Hypertension Brother    Hypertension Brother    Cancer Maternal Aunt        breast    Objective: Office vital signs reviewed. BP 131/76   Pulse 84   Temp (!) 97.3 F (36.3 C)   Ht 5' 4"  (1.626 m)   Wt 139 lb 12.8 oz (63.4 kg)   SpO2 99%   BMI 24.00 kg/m   Physical Examination:  General: Awake, alert, well nourished, No acute distress HEENT: Normal; sclera white.  No goiter.  No exophthalmos Cardio: regular rate and rhythm, S1S2 heard, no murmurs appreciated Pulm: clear to auscultation bilaterally, no wheezes, rhonchi or rales; normal work of breathing on room air GI: soft, non-tender, non-distended, bowel sounds present x4, no hepatomegaly, no splenomegaly, no masses GU: No palpable uterine masses or adnexal masses  externally Extremities: warm, well perfused, No edema, cyanosis or clubbing; +2 pulses bilaterally  Assessment/ Plan: 70 y.o. female   Pure hypercholesterolemia - Plan: Lipid panel, CMP14+EGFR  Essential hypertension  Unexplained weight loss - Plan: TSH, CBC with Differential, DG Chest 2 View, Fecal occult blood, imunochemical  Check lipid panel, CMP for follow-up on cholesterol.  Continue Livalo 2 mg daily as prescribed  Blood pressure well controlled  Uncertain etiology of unplanned weight loss.  She certainly has had about 8 pound difference from her February check to today.  She has no apparent risk factors except for possible genetic risk.  FOBT, TSH, CBC, CMP ordered.  Chest x-ray also ordered  Orders Placed This Encounter  Procedures   Fecal occult blood, imunochemical    Standing Status:   Future    Standing Expiration Date:   02/03/2021   DG Chest 2 View    Standing Status:   Future    Standing Expiration Date:   12/04/2021    Order Specific Question:   Reason for Exam (SYMPTOM  OR DIAGNOSIS REQUIRED)    Answer:   unexplained weight loss    Order Specific Question:   Preferred imaging location?    Answer:   Internal   TSH   CBC with Differential   No orders of the defined types were placed in this encounter.    Janora Norlander, DO [place] [phone]

## 2020-12-05 LAB — CBC WITH DIFFERENTIAL/PLATELET
Basophils Absolute: 0.1 10*3/uL (ref 0.0–0.2)
Basos: 1 %
EOS (ABSOLUTE): 0.2 10*3/uL (ref 0.0–0.4)
Eos: 2 %
Hematocrit: 41.4 % (ref 34.0–46.6)
Hemoglobin: 14.3 g/dL (ref 11.1–15.9)
Immature Grans (Abs): 0 10*3/uL (ref 0.0–0.1)
Immature Granulocytes: 0 %
Lymphocytes Absolute: 2.3 10*3/uL (ref 0.7–3.1)
Lymphs: 30 %
MCH: 30.2 pg (ref 26.6–33.0)
MCHC: 34.5 g/dL (ref 31.5–35.7)
MCV: 87 fL (ref 79–97)
Monocytes Absolute: 0.5 10*3/uL (ref 0.1–0.9)
Monocytes: 6 %
Neutrophils Absolute: 4.7 10*3/uL (ref 1.4–7.0)
Neutrophils: 61 %
Platelets: 302 10*3/uL (ref 150–450)
RBC: 4.74 x10E6/uL (ref 3.77–5.28)
RDW: 13.1 % (ref 11.7–15.4)
WBC: 7.7 10*3/uL (ref 3.4–10.8)

## 2020-12-05 LAB — CMP14+EGFR
ALT: 11 IU/L (ref 0–32)
AST: 14 IU/L (ref 0–40)
Albumin/Globulin Ratio: 2.3 — ABNORMAL HIGH (ref 1.2–2.2)
Albumin: 4.8 g/dL (ref 3.8–4.8)
Alkaline Phosphatase: 96 IU/L (ref 44–121)
BUN/Creatinine Ratio: 11 — ABNORMAL LOW (ref 12–28)
BUN: 10 mg/dL (ref 8–27)
Bilirubin Total: 0.5 mg/dL (ref 0.0–1.2)
CO2: 24 mmol/L (ref 20–29)
Calcium: 10 mg/dL (ref 8.7–10.3)
Chloride: 103 mmol/L (ref 96–106)
Creatinine, Ser: 0.95 mg/dL (ref 0.57–1.00)
Globulin, Total: 2.1 g/dL (ref 1.5–4.5)
Glucose: 96 mg/dL (ref 65–99)
Potassium: 4.1 mmol/L (ref 3.5–5.2)
Sodium: 143 mmol/L (ref 134–144)
Total Protein: 6.9 g/dL (ref 6.0–8.5)
eGFR: 65 mL/min/{1.73_m2} (ref >59)

## 2020-12-05 LAB — LIPID PANEL
Chol/HDL Ratio: 3.7 ratio (ref 0.0–4.4)
Cholesterol, Total: 170 mg/dL (ref 100–199)
HDL: 46 mg/dL (ref >39)
LDL Chol Calc (NIH): 102 mg/dL — ABNORMAL HIGH (ref 0–99)
Triglycerides: 121 mg/dL (ref 0–149)
VLDL Cholesterol Cal: 22 mg/dL (ref 5–40)

## 2020-12-05 LAB — TSH: TSH: 1.94 u[IU]/mL (ref 0.450–4.500)

## 2020-12-07 ENCOUNTER — Ambulatory Visit (INDEPENDENT_AMBULATORY_CARE_PROVIDER_SITE_OTHER): Payer: Medicare PPO

## 2020-12-07 VITALS — Ht 64.0 in | Wt 139.0 lb

## 2020-12-07 DIAGNOSIS — Z0000 Encounter for general adult medical examination without abnormal findings: Secondary | ICD-10-CM

## 2020-12-07 NOTE — Patient Instructions (Signed)
Candice Beck , Thank you for taking time to come for your Medicare Wellness Visit. I appreciate your ongoing commitment to your health goals. Please review the following plan we discussed and let me know if I can assist you in the future.   Screening recommendations/referrals: Colonoscopy: Done 12/27/2016 - Repeat in 10 years Mammogram: Done 04/02/2020 - Repeat annually Bone Density: Done 09/16/2019 - Repeat every 2 years Recommended yearly ophthalmology/optometry visit for glaucoma screening and checkup Recommended yearly dental visit for hygiene and checkup  Vaccinations: Influenza vaccine: Patient declined Pneumococcal vaccine: Patient declined Tdap vaccine: Done 09/29/2011 - Repeat in 10 years Shingles vaccine: Zostavax done 09/29/2011 - Due for shingrix - patient declined   Covid-19: 08/22/19, 09/19/19, & ~12/1/2021Done   Advanced directives: Advance directive discussed with you today. I have provided a copy for you to complete at home and have notarized. Once this is complete please bring a copy in to our office so we can scan it into your chart.  Conditions/risks identified: Keep up the great work! Aim for 30 minutes of exercise or brisk walking each day, drink 6-8 glasses of water and eat lots of fruits and vegetables.  Next appointment: Follow up in one year for your annual wellness visit    Preventive Care 65 Years and Older, Female Preventive care refers to lifestyle choices and visits with your health care provider that can promote health and wellness. What does preventive care include? A yearly physical exam. This is also called an annual well check. Dental exams once or twice a year. Routine eye exams. Ask your health care provider how often you should have your eyes checked. Personal lifestyle choices, including: Daily care of your teeth and gums. Regular physical activity. Eating a healthy diet. Avoiding tobacco and drug use. Limiting alcohol use. Practicing safe  sex. Taking low-dose aspirin every day. Taking vitamin and mineral supplements as recommended by your health care provider. What happens during an annual well check? The services and screenings done by your health care provider during your annual well check will depend on your age, overall health, lifestyle risk factors, and family history of disease. Counseling  Your health care provider may ask you questions about your: Alcohol use. Tobacco use. Drug use. Emotional well-being. Home and relationship well-being. Sexual activity. Eating habits. History of falls. Memory and ability to understand (cognition). Work and work Astronomer. Reproductive health. Screening  You may have the following tests or measurements: Height, weight, and BMI. Blood pressure. Lipid and cholesterol levels. These may be checked every 5 years, or more frequently if you are over 70 years old. Skin check. Lung cancer screening. You may have this screening every year starting at age 46 if you have a 30-pack-year history of smoking and currently smoke or have quit within the past 15 years. Fecal occult blood test (FOBT) of the stool. You may have this test every year starting at age 65. Flexible sigmoidoscopy or colonoscopy. You may have a sigmoidoscopy every 5 years or a colonoscopy every 10 years starting at age 14. Hepatitis C blood test. Hepatitis B blood test. Sexually transmitted disease (STD) testing. Diabetes screening. This is done by checking your blood sugar (glucose) after you have not eaten for a while (fasting). You may have this done every 1-3 years. Bone density scan. This is done to screen for osteoporosis. You may have this done starting at age 46. Mammogram. This may be done every 1-2 years. Talk to your health care provider about how often you  should have regular mammograms. Talk with your health care provider about your test results, treatment options, and if necessary, the need for more  tests. Vaccines  Your health care provider may recommend certain vaccines, such as: Influenza vaccine. This is recommended every year. Tetanus, diphtheria, and acellular pertussis (Tdap, Td) vaccine. You may need a Td booster every 10 years. Zoster vaccine. You may need this after age 22. Pneumococcal 13-valent conjugate (PCV13) vaccine. One dose is recommended after age 14. Pneumococcal polysaccharide (PPSV23) vaccine. One dose is recommended after age 65. Talk to your health care provider about which screenings and vaccines you need and how often you need them. This information is not intended to replace advice given to you by your health care provider. Make sure you discuss any questions you have with your health care provider. Document Released: 07/03/2015 Document Revised: 02/24/2016 Document Reviewed: 04/07/2015 Elsevier Interactive Patient Education  2017 [employer] Prevention in the Home Falls can cause injuries. They can happen to people of all ages. There are many things you can do to make your home safe and to help prevent falls. What can I do on the outside of my home? Regularly fix the edges of walkways and driveways and fix any cracks. Remove anything that might make you trip as you walk through a door, such as a raised step or threshold. Trim any bushes or trees on the path to your home. Use bright outdoor lighting. Clear any walking paths of anything that might make someone trip, such as rocks or tools. Regularly check to see if handrails are loose or broken. Make sure that both sides of any steps have handrails. Any raised decks and porches should have guardrails on the edges. Have any leaves, snow, or ice cleared regularly. Use sand or salt on walking paths during winter. Clean up any spills in your garage right away. This includes oil or grease spills. What can I do in the bathroom? Use night lights. Install grab bars by the toilet and in the tub and shower.  Do not use towel bars as grab bars. Use non-skid mats or decals in the tub or shower. If you need to sit down in the shower, use a plastic, non-slip stool. Keep the floor dry. Clean up any water that spills on the floor as soon as it happens. Remove soap buildup in the tub or shower regularly. Attach bath mats securely with double-sided non-slip rug tape. Do not have throw rugs and other things on the floor that can make you trip. What can I do in the bedroom? Use night lights. Make sure that you have a light by your bed that is easy to reach. Do not use any sheets or blankets that are too big for your bed. They should not hang down onto the floor. Have a firm chair that has side arms. You can use this for support while you get dressed. Do not have throw rugs and other things on the floor that can make you trip. What can I do in the kitchen? Clean up any spills right away. Avoid walking on wet floors. Keep items that you use a lot in easy-to-reach places. If you need to reach something above you, use a strong step stool that has a grab bar. Keep electrical cords out of the way. Do not use floor polish or wax that makes floors slippery. If you must use wax, use non-skid floor wax. Do not have throw rugs and other things on the  floor that can make you trip. What can I do with my stairs? Do not leave any items on the stairs. Make sure that there are handrails on both sides of the stairs and use them. Fix handrails that are broken or loose. Make sure that handrails are as long as the stairways. Check any carpeting to make sure that it is firmly attached to the stairs. Fix any carpet that is loose or worn. Avoid having throw rugs at the top or bottom of the stairs. If you do have throw rugs, attach them to the floor with carpet tape. Make sure that you have a light switch at the top of the stairs and the bottom of the stairs. If you do not have them, ask someone to add them for you. What else  can I do to help prevent falls? Wear shoes that: Do not have high heels. Have rubber bottoms. Are comfortable and fit you well. Are closed at the toe. Do not wear sandals. If you use a stepladder: Make sure that it is fully opened. Do not climb a closed stepladder. Make sure that both sides of the stepladder are locked into place. Ask someone to hold it for you, if possible. Clearly mark and make sure that you can see: Any grab bars or handrails. First and last steps. Where the edge of each step is. Use tools that help you move around (mobility aids) if they are needed. These include: Canes. Walkers. Scooters. Crutches. Turn on the lights when you go into a dark area. Replace any light bulbs as soon as they burn out. Set up your furniture so you have a clear path. Avoid moving your furniture around. If any of your floors are uneven, fix them. If there are any pets around you, be aware of where they are. Review your medicines with your doctor. Some medicines can make you feel dizzy. This can increase your chance of falling. Ask your doctor what other things that you can do to help prevent falls. This information is not intended to replace advice given to you by your health care provider. Make sure you discuss any questions you have with your health care provider. Document Released: 04/02/2009 Document Revised: 11/12/2015 Document Reviewed: 07/11/2014 Elsevier Interactive Patient Education  2017 [employer].

## 2020-12-07 NOTE — Progress Notes (Signed)
Subjective:   Candice Beck is a 70 y.o. female who presents for Medicare Annual (Subsequent) preventive examination.  Virtual Visit via Telephone Note  I connected with  Candice KehrJoyce R Burnside on 12/07/20 at  9:45 AM EDT by telephone and verified that I am speaking with the correct person using two identifiers.  Location: Patient: Home Provider: [facility] Persons participating in the virtual visit: patient/Nurse Health Advisor   I discussed the limitations, risks, security and privacy concerns of performing an evaluation and management service by telephone and the availability of in person appointments. The patient expressed understanding and agreed to proceed.  Interactive audio and video telecommunications were attempted between this nurse and patient, however failed, due to patient having technical difficulties OR patient did not have access to video capability.  We continued and completed visit with audio only.  Some vital signs may be absent or patient reported.   Gearald Stonebraker E Emon Lance, LPN   Review of Systems     Cardiac Risk Factors include: advanced age (>5355men, 63>65 women);dyslipidemia;hypertension     Objective:    Today's Vitals   12/07/20 0951  Weight: 139 lb (63 kg)  Height: 5\' 4"  (1.626 m)   Body mass index is 23.86 kg/m.  Advanced Directives 12/07/2020 11/22/2018  Does Patient Have a Medical Advance Directive? No No  Would patient like information on creating a medical advance directive? Yes (MAU/Ambulatory/Procedural Areas - Information given) Yes (MAU/Ambulatory/Procedural Areas - Information given)    Current Medications (verified) Outpatient Encounter Medications as of 12/07/2020  Medication Sig   cholecalciferol (VITAMIN D) 1000 UNITS tablet Take 5,000 Units by mouth daily.    Coenzyme Q10 (CO Q 10 PO) Take 1 tablet by mouth daily.   EPINEPHrine 0.3 mg/0.3 mL IJ SOAJ injection Inject 0.3 mLs (0.3 mg total) into the muscle as needed (for anaphylactic / allergic  reaction). (Patient not taking: Reported on 12/04/2020)   quinapril (ACCUPRIL) 20 MG tablet TAKE 1 TABLET BY MOUTH EVERY DAY   valACYclovir (VALTREX) 1000 MG tablet Take 2 tablets (2,000 mg total) by mouth 2 (two) times daily. x1 day per flare.   No facility-administered encounter medications on file as of 12/07/2020.    Allergies (verified) Patient has no known allergies.   History: Past Medical History:  Diagnosis Date   Hyperlipidemia    Hypertension    Vitamin D deficiency    Past Surgical History:  Procedure Laterality Date   TONSILLECTOMY     Family History  Problem Relation Age of Onset   Cancer Mother        breast    Heart disease Mother        aortic valve replaced x2   Osteoporosis Mother    Cancer Father        throat   Heart disease Brother        heart attack age 68   Diabetes Brother        due to weight   Hypertension Brother    Hypertension Brother    Cancer Maternal Aunt        breast   Social History   Socioeconomic History   Marital status: Married    Spouse name: Not on file   Number of children: 2   Years of education: 14   Highest education level: Not on file  Occupational History   Occupation: Retired    Associate Professormployer: [employer]    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  Tobacco Use  Smoking status: Never   Smokeless tobacco: Never  Vaping Use   Vaping Use: Never used  Substance and Sexual Activity   Alcohol use: Never   Drug use: No   Sexual activity: Yes  Other Topics Concern   Not on file  Social History Narrative   Lives home with her husband - daughter lives next door, son in [place], [place]   Enjoys working in her garden and walking her dog   Social Determinants of Corporate investment banker Strain: Low Risk    Difficulty of Paying Living Expenses: Not hard at all  Food Insecurity: No Food Insecurity   Worried About Programme researcher, broadcasting/film/video in the Last Year: Never true   Barista in the Last Year: Never  true  Transportation Needs: No Transportation Needs   Lack of Transportation (Medical): No   Lack of Transportation (Non-Medical): No  Physical Activity: Sufficiently Active   Days of Exercise per Week: 7 days   Minutes of Exercise per Session: 30 min  Stress: No Stress Concern Present   Feeling of Stress : Not at all  Social Connections: Socially Integrated   Frequency of Communication with Friends and Family: More than three times a week   Frequency of Social Gatherings with Friends and Family: Twice a week   Attends Religious Services: More than 4 times per year   Active Member of [employer] or Organizations: Yes   Attends Engineer, structural: More than 4 times per year   Marital Status: Married    Tobacco Counseling Counseling given: Not Answered   Clinical Intake:  Pre-visit preparation completed: Yes  Pain : No/denies pain     BMI - recorded: 23.86 Nutritional Status: BMI of 19-24  Normal Nutritional Risks: None Diabetes: No  How often do you need to have someone help you when you read instructions, pamphlets, or other written materials from your doctor or pharmacy?: 1 - Never  Diabetic? No  Interpreter Needed?: No  Information entered by :: Domini Vandehei, LPN   Activities of Daily Living In your present state of health, do you have any difficulty performing the following activities: 12/07/2020  Hearing? N  Vision? N  Difficulty concentrating or making decisions? N  Walking or climbing stairs? N  Dressing or bathing? N  Doing errands, shopping? N  Preparing Food and eating ? N  Using the Toilet? N  In the past six months, have you accidently leaked urine? N  Do you have problems with loss of bowel control? N  Managing your Medications? N  Managing your Finances? N  Housekeeping or managing your Housekeeping? N  Some recent data might be hidden    Patient Care Team: Raliegh Ip, DO as PCP - General (Family Medicine) Valeria Batman, MD  as Consulting Physician (Orthopedic Surgery)  Indicate any recent Medical Services you may have received from other than [facility] providers in the past year (date may be approximate).     Assessment:   This is a routine wellness examination for Jordain.  Hearing/Vision screen Hearing Screening - Comments:: Denies hearing difficulties Vision Screening - Comments:: Wears reading glasses only prn - up to date with annual eye exams with [facility]  Dietary issues and exercise activities discussed: Current Exercise Habits: Home exercise routine, Type of exercise: walking;Other - see comments (working in her garden, yard work, house work, Conservation officer, historic buildings), Time (Minutes): 45, Frequency (Times/Week): 7, Weekly Exercise (Minutes/Week): 315, Intensity: Mild, Exercise limited by: orthopedic  condition(s)   Goals Addressed             This Visit's Progress    Increase physical activity   On track    Walk at least times per week for 30 minutes each session.         Depression Screen PHQ 2/9 Scores 12/07/2020 07/29/2020 03/18/2020 09/16/2019 03/19/2019 11/22/2018 06/05/2018  PHQ - 2 Score 0 0 0 0 0 0 0  PHQ- 9 Score - 0 - - - - -    Fall Risk Fall Risk  12/07/2020 07/29/2020 03/18/2020 09/16/2019 03/19/2019  Falls in the past year? 0 0 0 0 0  Number falls in past yr: 0 - - - -  Injury with Fall? 0 - - - -  Risk for fall due to : Orthopedic patient - - - -  Follow up Falls prevention discussed - - - -    FALL RISK PREVENTION PERTAINING TO THE HOME:  Any stairs in or around the home? Yes  If so, are there any without handrails? No  Home free of loose throw rugs in walkways, pet beds, electrical cords, etc? Yes  Adequate lighting in your home to reduce risk of falls? Yes   ASSISTIVE DEVICES UTILIZED TO PREVENT FALLS:  Life alert? No  Use of a cane, Candice or w/c? No  Grab bars in the bathroom? No  Shower chair or bench in shower? No  Elevated toilet seat or a handicapped toilet? No   TIMED UP AND  GO:  Was the test performed? No . Telephonic visit.  Cognitive Function: Normal cognitive status assessed by direct observation by this Nurse Health Advisor. No abnormalities found.  MMSE - Mini Mental State Exam 01/31/2017  Orientation to time 5  Orientation to Place 5  Registration 3  Attention/ Calculation 5  Recall 3  Language- name 2 objects 2  Language- repeat 1  Language- follow 3 step command 3  Language- read & follow direction 1  Write a sentence 1  Copy design 1  Total score 30     6CIT Screen 11/22/2018  What Year? 0 points  What month? 0 points  What time? 0 points  Count back from 20 0 points  Months in reverse 0 points  Repeat phrase 0 points  Total Score 0    Immunizations Immunization History  Administered Date(s) Administered   Moderna Sars-Covid-2 Vaccination 08/22/2019, 09/19/2019, 05/20/2020   Tdap 09/29/2011   Zoster, Live 09/29/2011    TDAP status: Up to date  Flu Vaccine status: Declined, Education has been provided regarding the importance of this vaccine but patient still declined. Advised may receive this vaccine at local pharmacy or Health Dept. Aware to provide a copy of the vaccination record if obtained from local pharmacy or Health Dept. Verbalized acceptance and understanding.  Pneumococcal vaccine status: Declined,  Education has been provided regarding the importance of this vaccine but patient still declined. Advised may receive this vaccine at local pharmacy or Health Dept. Aware to provide a copy of the vaccination record if obtained from local pharmacy or Health Dept. Verbalized acceptance and understanding.   Covid-19 vaccine status: Completed vaccines  Qualifies for Shingles Vaccine? Yes   Zostavax completed Yes   Shingrix Completed?: No.    Education has been provided regarding the importance of this vaccine. Patient has been advised to call insurance company to determine out of pocket expense if they have not yet received this  vaccine. Advised may also receive vaccine at  local pharmacy or Health Dept. Verbalized acceptance and understanding.  Screening Tests Health Maintenance  Topic Date Due   Zoster Vaccines- Shingrix (1 of 2) Never done   COLON CANCER SCREENING ANNUAL FOBT  04/28/2016   PNA vac Low Risk Adult (1 of 2 - PCV13) Never done   COVID-19 Vaccine (4 - Booster for Moderna series) 09/18/2020   INFLUENZA VACCINE  01/18/2021   MAMMOGRAM  03/13/2021   DEXA SCAN  09/15/2021   TETANUS/TDAP  09/28/2021   COLONOSCOPY (Pts 45-75yrs Insurance coverage will need to be confirmed)  12/28/2026   Hepatitis C Screening  Completed   HPV VACCINES  Aged Out    Health Maintenance  Health Maintenance Due  Topic Date Due   Zoster Vaccines- Shingrix (1 of 2) Never done   COLON CANCER SCREENING ANNUAL FOBT  04/28/2016   PNA vac Low Risk Adult (1 of 2 - PCV13) Never done   COVID-19 Vaccine (4 - Booster for Moderna series) 09/18/2020    Colorectal cancer screening: Type of screening: Colonoscopy. Completed 12/27/2016. Repeat every 10 years  Mammogram status: Completed 04/02/2020. Repeat every year  Bone Density status: Completed 09/16/2019. Results reflect: Bone density results: OSTEOPENIA. Repeat every 2 years.  Lung Cancer Screening: (Low Dose CT Chest recommended if Age 57-80 years, 30 pack-year currently smoking OR have quit w/in 15years.) does not qualify.   Additional Screening:  Hepatitis C Screening: does qualify; Completed 06/01/2017  Vision Screening: Recommended annual ophthalmology exams for early detection of glaucoma and other disorders of the eye. Is the patient up to date with their annual eye exam?  Yes  Who is the provider or what is the name of the office in which the patient attends annual eye exams? [facility] If pt is not established with a provider, would they like to be referred to a provider to establish care? No .   Dental Screening: Recommended annual dental exams for proper oral  hygiene  Community Resource Referral / Chronic Care Management: CRR required this visit?  No   CCM required this visit?  No      Plan:     I have personally reviewed and noted the following in the patient's chart:   Medical and social history Use of alcohol, tobacco or illicit drugs  Current medications and supplements including opioid prescriptions.  Functional ability and status Nutritional status Physical activity Advanced directives List of other physicians Hospitalizations, surgeries, and ER visits in previous 12 months Vitals Screenings to include cognitive, depression, and falls Referrals and appointments  In addition, I have reviewed and discussed with patient certain preventive protocols, quality metrics, and best practice recommendations. A written personalized care plan for preventive services as well as general preventive health recommendations were provided to patient.     [place] Constable, LPN   4/40/3474   Nurse Notes: None

## 2020-12-24 ENCOUNTER — Other Ambulatory Visit: Payer: Medicare PPO

## 2020-12-24 DIAGNOSIS — R634 Abnormal weight loss: Secondary | ICD-10-CM | POA: Diagnosis not present

## 2020-12-25 LAB — FECAL OCCULT BLOOD, IMMUNOCHEMICAL: Fecal Occult Bld: NEGATIVE

## 2021-01-12 ENCOUNTER — Encounter: Payer: Self-pay | Admitting: Family

## 2021-01-12 ENCOUNTER — Ambulatory Visit (INDEPENDENT_AMBULATORY_CARE_PROVIDER_SITE_OTHER): Payer: Medicare PPO | Admitting: Family

## 2021-01-12 VITALS — BP 169/89 | HR 101 | Temp 97.7°F | Wt 137.0 lb

## 2021-01-12 DIAGNOSIS — J029 Acute pharyngitis, unspecified: Secondary | ICD-10-CM

## 2021-01-12 DIAGNOSIS — R509 Fever, unspecified: Secondary | ICD-10-CM | POA: Diagnosis not present

## 2021-01-12 LAB — VERITOR FLU A/B WAIVED
Influenza A: NEGATIVE
Influenza B: NEGATIVE

## 2021-01-12 LAB — RAPID STREP SCREEN (MED CTR MEBANE ONLY): Strep Gp A Ag, IA W/Reflex: NEGATIVE

## 2021-01-12 LAB — CULTURE, GROUP A STREP

## 2021-01-12 MED ORDER — AMOXICILLIN-POT CLAVULANATE 875-125 MG PO TABS: 1.0000 | ORAL_TABLET | Freq: Two times a day (BID) | ORAL | 0 refills | Status: DC

## 2021-01-12 NOTE — Patient Instructions (Signed)
Pharyngitis  Pharyngitis is redness, pain, and swelling (inflammation) of the throat (pharynx). It is a very common cause of sore throat. Pharyngitis can be caused by a bacteria, but it is usually caused by a virus. Most cases of pharyngitis getbetter on their own without treatment. What are the causes? This condition may be caused by: Infection by viruses (viral). Viral pharyngitis spreads from person to person (is contagious) through coughing, sneezing, and sharing of personal items or utensils such as cups, forks, spoons, and toothbrushes. Infection by bacteria (bacterial). Bacterial pharyngitis may be spread by touching the nose or face after coming in contact with the bacteria, or through more intimate contact, such as kissing. Allergies. Allergies can cause buildup of mucus in the throat (post-nasal drip), leading to inflammation and irritation. Allergies can also cause blocked nasal passages, forcing breathing through the mouth, which dries and irritates the throat. What increases the risk? You are more likely to develop this condition if: You are 5-24 years old. You are exposed to crowded environments such as daycare, school, or dormitory living. You live in a cold climate. You have a weakened disease-fighting (immune) system. What are the signs or symptoms? Symptoms of this condition vary by the cause (viral, bacterial, or allergies) and can include: Sore throat. Fatigue. Low-grade fever. Headache. Joint pain and muscle aches. Skin rashes. Swollen glands in the throat (lymph nodes). Plaque-like film on the throat or tonsils. This is often a symptom of bacterial pharyngitis. Vomiting. Stuffy nose (nasal congestion). Cough. Red, itchy eyes (conjunctivitis). Loss of appetite. How is this diagnosed? This condition is often diagnosed based on your medical history and a physical exam. Your health care provider will ask you questions about your illness and your symptoms. A swab of  your throat may be done to check for bacteria (rapid strep test). Other lab tests may also be done, depending on the suspected cause, butthese are rare. How is this treated? This condition usually gets better in 3-4 days without medicine. Bacterialpharyngitis may be treated with antibiotic medicines. Follow these instructions at home: Take over-the-counter and prescription medicines only as told by your health care provider. If you were prescribed an antibiotic medicine, take it as told by your health care provider. Do not stop taking the antibiotic even if you start to feel better. Do not give children aspirin because of the association with Reye syndrome. Drink enough water and fluids to keep your urine clear or pale yellow. Get a lot of rest. Gargle with a salt-water mixture 3-4 times a day or as needed. To make a salt-water mixture, completely dissolve -1 tsp of salt in 1 cup of warm water. Do not swallow this mixture. If your health care provider approves, you may use throat lozenges or sprays to soothe your throat. Contact a health care provider if: You have large, tender lumps in your neck. You have a rash. You cough up green, yellow-brown, or bloody spit. Get help right away if: Your neck becomes stiff. You drool or are unable to swallow liquids. You cannot drink or take medicines without vomiting. You have severe pain that does not go away, even after you take medicine. You have trouble breathing, and it is not caused by a stuffy nose. You have new pain and swelling in your joints such as the knees, ankles, wrists, or elbows. Summary Pharyngitis is redness, pain, and swelling (inflammation) of the throat (pharynx). While pharyngitis can be caused by a bacteria, the most common causes are viral. Most cases   of pharyngitis get better on their own without treatment. Bacterial pharyngitis is treated with antibiotic medicines. This information is not intended to replace advice given  to you by your health care provider. Make sure you discuss any questions you have with your healthcare provider. Document Revised: 05/07/2020 Document Reviewed: 05/07/2020 Elsevier Patient Education  2022 Elsevier Inc.  

## 2021-01-12 NOTE — Progress Notes (Signed)
Subjective:    Patient ID: Candice Beck, female    DOB: 1951/04/23, 70 y.o.   MRN: [id number]  Chief Complaint  Patient presents with   swollen lyhm nodes    Sore Throat    3 days ago    Fever   Chills    Sore Throat  This is a new problem. The current episode started in the past 7 days. The problem has been unchanged. The maximum temperature recorded prior to her arrival was 100.4 - 100.9 F. The pain is at a severity of 3/10. The pain is moderate. Associated symptoms include coughing (very small), ear pain, headaches and swollen glands. Pertinent negatives include no congestion or shortness of breath. She has tried acetaminophen and NSAIDs for the symptoms. The treatment provided mild relief.  Fever  Associated symptoms include coughing (very small), ear pain and headaches. Pertinent negatives include no congestion.     Review of Systems  Constitutional:  Positive for fever.  HENT:  Positive for ear pain. Negative for congestion.   Respiratory:  Positive for cough (very small). Negative for shortness of breath.   Neurological:  Positive for headaches.  All other systems reviewed and are negative.     Objective:   Physical Exam Vitals reviewed.  Constitutional:      General: She is not in acute distress.    Appearance: She is well-developed.  HENT:     Head: Normocephalic and atraumatic.     Right Ear: Tympanic membrane normal.     Left Ear: Tympanic membrane normal.     Mouth/Throat:     Pharynx: Posterior oropharyngeal erythema present.  Eyes:     Pupils: Pupils are equal, round, and reactive to light.  Neck:     Thyroid: No thyromegaly.  Cardiovascular:     Rate and Rhythm: Normal rate and regular rhythm.     Heart sounds: Normal heart sounds. No murmur heard. Pulmonary:     Effort: Pulmonary effort is normal. No respiratory distress.     Breath sounds: Normal breath sounds. No wheezing.  Abdominal:     General: Bowel sounds are normal. There is no  distension.     Palpations: Abdomen is soft.     Tenderness: There is no abdominal tenderness.  Musculoskeletal:        General: No tenderness. Normal range of motion.     Cervical back: Normal range of motion and neck supple.  Skin:    General: Skin is warm and dry.  Neurological:     Mental Status: She is alert and oriented to person, place, and time.     Cranial Nerves: No cranial nerve deficit.     Deep Tendon Reflexes: Reflexes are normal and symmetric.  Psychiatric:        Behavior: Behavior normal.        Thought Content: Thought content normal.        Judgment: Judgment normal.      BP (!) 169/89   Pulse (!) 101   Temp 97.7 F (36.5 C) (Temporal)   Wt 137 lb (62.1 kg)   SpO2 98%   BMI 23.52 kg/m      Assessment & Plan:  Candice Beck comes in today with chief complaint of swollen lyhm nodes , Sore Throat (3 days ago ), Fever, and Chills   Diagnosis and orders addressed:  1. Fever, unspecified fever cause  - Veritor Flu A/B Waived - Rapid Strep Screen ([facility] ONLY) - Novel  Coronavirus, NAA ([facility])  2. Acute pharyngitis, unspecified etiology - Take meds as prescribed - Use a cool mist humidifier  -Use saline nose sprays frequently -Force fluids -For any cough or congestion  Use plain Mucinex- regular strength or max strength is fine -For fever or aces or pains- take tylenol or ibuprofen. -Throat lozenges if help -New toothbrush in 3 days - PT will continue OTC medications for next 2-3 days, if worsens or do not improve start Augmentin  - amoxicillin-clavulanate (AUGMENTIN) 875-125 MG tablet; Take 1 tablet by mouth 2 (two) times daily.  Dispense: 14 tablet; Refill: 0   Jannifer Rodney, FNP

## 2021-01-13 LAB — NOVEL CORONAVIRUS, NAA: SARS-CoV-2, NAA: NOT DETECTED

## 2021-01-13 LAB — SARS-COV-2, NAA 2 DAY TAT

## 2021-01-15 ENCOUNTER — Ambulatory Visit: Payer: Medicare PPO | Admitting: Family Medicine

## 2021-01-29 DIAGNOSIS — I10 Essential (primary) hypertension: Secondary | ICD-10-CM | POA: Diagnosis not present

## 2021-01-29 DIAGNOSIS — E785 Hyperlipidemia, unspecified: Secondary | ICD-10-CM | POA: Diagnosis not present

## 2021-01-29 DIAGNOSIS — Z8249 Family history of ischemic heart disease and other diseases of the circulatory system: Secondary | ICD-10-CM | POA: Diagnosis not present

## 2021-01-29 DIAGNOSIS — Z803 Family history of malignant neoplasm of breast: Secondary | ICD-10-CM | POA: Diagnosis not present

## 2021-02-04 ENCOUNTER — Other Ambulatory Visit: Payer: Self-pay

## 2021-02-04 ENCOUNTER — Encounter: Payer: Self-pay | Admitting: Family Medicine

## 2021-02-04 ENCOUNTER — Ambulatory Visit (INDEPENDENT_AMBULATORY_CARE_PROVIDER_SITE_OTHER): Payer: Medicare PPO | Admitting: Family Medicine

## 2021-02-04 ENCOUNTER — Ambulatory Visit (INDEPENDENT_AMBULATORY_CARE_PROVIDER_SITE_OTHER): Payer: Medicare PPO

## 2021-02-04 VITALS — BP 145/83 | HR 97 | Temp 97.9°F | Ht 64.0 in | Wt 137.4 lb

## 2021-02-04 DIAGNOSIS — R634 Abnormal weight loss: Secondary | ICD-10-CM

## 2021-02-04 NOTE — Progress Notes (Signed)
Subjective: CC: Unplanned weight loss PCP: Raliegh Ip, DO NAT:FTDDU R Ceniceros is a 70 y.o. female presenting to clinic today for:  1.  Unplanned weight loss Patient was seen in June for regular checkup.  At that time she had about an 8 pound unplanned weight loss.  This seemed to be attributable to increase physical activity but we did go ahead and perform a metabolic work-up.  Her labs were unrevealing.  She had negative FOBT.  Chest x-ray was ordered but has yet to be obtained.  She is here for weight recheck.  She unfortunately was ill recently.  She has since recovered.  She reports a good oral intake.  She remains physically active   ROS: Per HPI  No Known Allergies Past Medical History:  Diagnosis Date   Hyperlipidemia    Hypertension    Vitamin D deficiency     Current Outpatient Medications:    cholecalciferol (VITAMIN D) 1000 UNITS tablet, Take 5,000 Units by mouth daily. , Disp: , Rfl:    Coenzyme Q10 (CO Q 10 PO), Take 1 tablet by mouth daily., Disp: , Rfl:    EPINEPHrine 0.3 mg/0.3 mL IJ SOAJ injection, Inject 0.3 mLs (0.3 mg total) into the muscle as needed (for anaphylactic / allergic reaction)., Disp: 1 each, Rfl: 3   quinapril (ACCUPRIL) 20 MG tablet, TAKE 1 TABLET BY MOUTH EVERY DAY, Disp: 90 tablet, Rfl: 0   valACYclovir (VALTREX) 1000 MG tablet, Take 2 tablets (2,000 mg total) by mouth 2 (two) times daily. x1 day per flare., Disp: 20 tablet, Rfl: 0 Social History   Socioeconomic History   Marital status: Married    Spouse name: Not on file   Number of children: 2   Years of education: 14   Highest education level: Not on file  Occupational History   Occupation: Retired    Associate Professor: Tour manager [employer]    Comment: Book Radio broadcast assistant  Tobacco Use   Smoking status: Never   Smokeless tobacco: Never  Vaping Use   Vaping Use: Never used  Substance and Sexual Activity   Alcohol use: Never   Drug use: No   Sexual activity: Yes   Other Topics Concern   Not on file  Social History Narrative   Lives home with her husband - daughter lives next door, son in [place], [place]   Enjoys working in her garden and walking her dog   Social Determinants of Corporate investment banker Strain: Low Risk    Difficulty of Paying Living Expenses: Not hard at all  Food Insecurity: No Food Insecurity   Worried About Programme researcher, broadcasting/film/video in the Last Year: Never true   Barista in the Last Year: Never true  Transportation Needs: No Transportation Needs   Lack of Transportation (Medical): No   Lack of Transportation (Non-Medical): No  Physical Activity: Sufficiently Active   Days of Exercise per Week: 7 days   Minutes of Exercise per Session: 30 min  Stress: No Stress Concern Present   Feeling of Stress : Not at all  Social Connections: Socially Integrated   Frequency of Communication with Friends and Family: More than three times a week   Frequency of Social Gatherings with Friends and Family: Twice a week   Attends Religious Services: More than 4 times per year   Active Member of [employer] or Organizations: Yes   Attends Banker Meetings: More than 4 times per year  Marital Status: Married  Catering manager Violence: Not At Risk   Fear of Current or Ex-Partner: No   Emotionally Abused: No   Physically Abused: No   Sexually Abused: No   Family History  Problem Relation Age of Onset   Cancer Mother        breast    Heart disease Mother        aortic valve replaced x2   Osteoporosis Mother    Cancer Father        throat   Heart disease Brother        heart attack age 39   Diabetes Brother        due to weight   Hypertension Brother    Hypertension Brother    Cancer Maternal Aunt        breast    Objective: Office vital signs reviewed. BP (!) 145/83   Pulse 97   Temp 97.9 F (36.6 C)   Ht 5\' 4"  (1.626 m)   Wt 137 lb 6.4 oz (62.3 kg)   SpO2 100%   BMI 23.58 kg/m   Physical Examination:   General: Awake, alert, well nourished, No acute distress Cardio: regular rate and rhythm, S1S2 heard, no murmurs appreciated Pulm: clear to auscultation bilaterally, no wheezes, rhonchi or rales; normal work of breathing on room air  No results found.  Assessment/ Plan: 70 y.o. female   Unexplained weight loss  About a 2 pound weight loss since her last visit but she has also been acutely ill recently.  I have recommended that we follow-up again in 3 months for recheck of weight.  Chest x-ray was obtained for completion but there were no abnormalities appreciated on pulmonary exam today.  No orders of the defined types were placed in this encounter.  No orders of the defined types were placed in this encounter.    75, DO [facility] [phone]

## 2021-03-06 ENCOUNTER — Other Ambulatory Visit: Payer: Self-pay | Admitting: Family

## 2021-03-06 DIAGNOSIS — B001 Herpesviral vesicular dermatitis: Secondary | ICD-10-CM

## 2021-03-19 ENCOUNTER — Encounter: Payer: Self-pay | Admitting: Nurse Practitioner

## 2021-03-19 ENCOUNTER — Ambulatory Visit: Payer: Medicare PPO | Admitting: Nurse Practitioner

## 2021-03-19 DIAGNOSIS — U071 COVID-19: Secondary | ICD-10-CM

## 2021-03-19 DIAGNOSIS — J029 Acute pharyngitis, unspecified: Secondary | ICD-10-CM | POA: Insufficient documentation

## 2021-03-19 LAB — RAPID STREP SCREEN (MED CTR MEBANE ONLY): Strep Gp A Ag, IA W/Reflex: NEGATIVE

## 2021-03-19 LAB — CULTURE, GROUP A STREP

## 2021-03-19 MED ORDER — NIRMATRELVIR/RITONAVIR (PAXLOVID)TABLET: 3.0000 | ORAL_TABLET | Freq: Two times a day (BID) | ORAL | 0 refills | Status: AC

## 2021-03-19 NOTE — Progress Notes (Signed)
   Virtual Visit  Note Due to COVID-19 pandemic this visit was conducted virtually. This visit type was conducted due to national recommendations for restrictions regarding the COVID-19 Pandemic (e.g. social distancing, sheltering in place) in an effort to limit this patient's exposure and mitigate transmission in our community. All issues noted in this document were discussed and addressed.  A physical exam was not performed with this format.  I connected with Candice Beck on 03/19/21 at 9:17 AM by telephone and verified that I am speaking with the correct person using two identifiers. Candice Beck is currently located at home during visit. The provider, Daryll Drown, NP is located in their office at time of visit.  I discussed the limitations, risks, security and privacy concerns of performing an evaluation and management service by telephone and the availability of in person appointments. I also discussed with the patient that there may be a patient responsible charge related to this service. The patient expressed understanding and agreed to proceed.   History and Present Illness:  URI  This is a new problem. The current episode started yesterday (The past 2 days). The problem has been gradually worsening. There has been no fever. Associated symptoms include congestion and a sore throat. Pertinent negatives include no abdominal pain, coughing, headaches or rash. She has tried nothing for the symptoms.     Review of Systems  Constitutional:  Negative for chills and fever.  HENT:  Positive for congestion and sore throat.   Respiratory:  Negative for cough.   Gastrointestinal:  Negative for abdominal pain.  Skin:  Negative for rash.  Neurological:  Negative for headaches.  All other systems reviewed and are negative.   Observations/Objective: Televisit patient not in distress  Assessment and Plan:  Take meds as prescribed - Use a cool mist humidifier  -Use saline nose sprays  frequently -Force fluids -Paxlovid antiviral pharmacy education provided -For fever or aches or pains- take Tylenol or ibuprofen. -At home COVID-19 test positive. Follow up with worsening unresolved symptoms   Follow Up Instructions: Follow-up with worsening unresolved symptoms.    I discussed the assessment and treatment plan with the patient. The patient was provided an opportunity to ask questions and all were answered. The patient agreed with the plan and demonstrated an understanding of the instructions.   The patient was advised to call back or seek an in-person evaluation if the symptoms worsen or if the condition fails to improve as anticipated.  The above assessment and management plan was discussed with the patient. The patient verbalized understanding of and has agreed to the management plan. Patient is aware to call the clinic if symptoms persist or worsen. Patient is aware when to return to the clinic for a follow-up visit. Patient educated on when it is appropriate to go to the emergency department.   Time call ended: 9:27 AM  I provided 10 minutes of  non face-to-face time during this encounter.    Daryll Drown, NP

## 2021-03-19 NOTE — Assessment & Plan Note (Signed)
Take meds as prescribed - Use a cool mist humidifier  -Use saline nose sprays frequently -Force fluids -Paxlovid antiviral pharmacy education provided -For fever or aches or pains- take Tylenol or ibuprofen. -At home COVID-19 test positive. Follow up with worsening unresolved symptoms

## 2021-03-21 LAB — NOVEL CORONAVIRUS, NAA: SARS-CoV-2, NAA: NOT DETECTED

## 2021-03-21 LAB — SARS-COV-2, NAA 2 DAY TAT

## 2021-03-26 ENCOUNTER — Other Ambulatory Visit: Payer: Self-pay | Admitting: Family Medicine

## 2021-03-26 DIAGNOSIS — I10 Essential (primary) hypertension: Secondary | ICD-10-CM

## 2021-04-22 DIAGNOSIS — Z1231 Encounter for screening mammogram for malignant neoplasm of breast: Secondary | ICD-10-CM | POA: Diagnosis not present

## 2021-05-04 ENCOUNTER — Ambulatory Visit: Payer: Medicare PPO | Admitting: Family Medicine

## 2021-06-10 ENCOUNTER — Other Ambulatory Visit: Payer: Self-pay | Admitting: Family Medicine

## 2021-06-10 DIAGNOSIS — E7800 Pure hypercholesterolemia, unspecified: Secondary | ICD-10-CM

## 2021-06-10 DIAGNOSIS — B001 Herpesviral vesicular dermatitis: Secondary | ICD-10-CM

## 2021-06-10 MED ORDER — VALACYCLOVIR HCL 1 G PO TABS: 1000.0000 mg | ORAL_TABLET | Freq: Every day | ORAL | 1 refills | Status: DC

## 2021-07-12 ENCOUNTER — Other Ambulatory Visit: Payer: Self-pay | Admitting: Family Medicine

## 2021-07-12 DIAGNOSIS — I10 Essential (primary) hypertension: Secondary | ICD-10-CM

## 2021-07-26 ENCOUNTER — Other Ambulatory Visit: Payer: Self-pay | Admitting: Family Medicine

## 2021-07-26 DIAGNOSIS — I10 Essential (primary) hypertension: Secondary | ICD-10-CM

## 2021-08-16 ENCOUNTER — Other Ambulatory Visit: Payer: Self-pay | Admitting: Family Medicine

## 2021-08-16 ENCOUNTER — Encounter: Payer: Self-pay | Admitting: Family Medicine

## 2021-08-16 DIAGNOSIS — I10 Essential (primary) hypertension: Secondary | ICD-10-CM

## 2021-08-17 MED ORDER — QUINAPRIL HCL 20 MG PO TABS: 20.0000 mg | ORAL_TABLET | Freq: Every day | ORAL | 0 refills | Status: DC

## 2021-08-21 DIAGNOSIS — M1990 Unspecified osteoarthritis, unspecified site: Secondary | ICD-10-CM | POA: Diagnosis not present

## 2021-08-21 DIAGNOSIS — Z8249 Family history of ischemic heart disease and other diseases of the circulatory system: Secondary | ICD-10-CM | POA: Diagnosis not present

## 2021-08-21 DIAGNOSIS — E785 Hyperlipidemia, unspecified: Secondary | ICD-10-CM | POA: Diagnosis not present

## 2021-08-21 DIAGNOSIS — I10 Essential (primary) hypertension: Secondary | ICD-10-CM | POA: Diagnosis not present

## 2021-08-21 DIAGNOSIS — Z803 Family history of malignant neoplasm of breast: Secondary | ICD-10-CM | POA: Diagnosis not present

## 2021-08-21 DIAGNOSIS — B009 Herpesviral infection, unspecified: Secondary | ICD-10-CM | POA: Diagnosis not present

## 2021-08-21 DIAGNOSIS — L409 Psoriasis, unspecified: Secondary | ICD-10-CM | POA: Diagnosis not present

## 2021-09-30 ENCOUNTER — Encounter: Payer: Self-pay | Admitting: Family Medicine

## 2021-09-30 ENCOUNTER — Other Ambulatory Visit: Payer: Self-pay | Admitting: Family Medicine

## 2021-09-30 DIAGNOSIS — I10 Essential (primary) hypertension: Secondary | ICD-10-CM

## 2021-09-30 NOTE — Telephone Encounter (Signed)
[place] NTBS 30 days given 08/17/21 ?

## 2021-09-30 NOTE — Telephone Encounter (Signed)
Called pt to schedule appt/LMTCB ?LETTER MAILED ?

## 2021-10-16 ENCOUNTER — Other Ambulatory Visit: Payer: Self-pay | Admitting: Family Medicine

## 2021-10-16 DIAGNOSIS — I10 Essential (primary) hypertension: Secondary | ICD-10-CM

## 2021-10-18 NOTE — Telephone Encounter (Signed)
30 days given 08/17/2021  - NTBS ?

## 2021-10-18 NOTE — Telephone Encounter (Signed)
Patient aware and will call back for appt ?

## 2021-11-12 ENCOUNTER — Encounter: Payer: Self-pay | Admitting: Family Medicine

## 2021-11-12 ENCOUNTER — Other Ambulatory Visit: Payer: Self-pay | Admitting: Family Medicine

## 2021-11-12 DIAGNOSIS — I10 Essential (primary) hypertension: Secondary | ICD-10-CM

## 2021-11-12 NOTE — Telephone Encounter (Signed)
NTBS. 90 day given 08/17/21

## 2021-11-12 NOTE — Telephone Encounter (Signed)
LMTCB/mailed letter 

## 2021-12-03 ENCOUNTER — Other Ambulatory Visit: Payer: Self-pay | Admitting: Family Medicine

## 2021-12-03 DIAGNOSIS — I10 Essential (primary) hypertension: Secondary | ICD-10-CM

## 2021-12-07 ENCOUNTER — Ambulatory Visit: Payer: Medicare PPO | Admitting: Family Medicine

## 2021-12-07 ENCOUNTER — Ambulatory Visit (INDEPENDENT_AMBULATORY_CARE_PROVIDER_SITE_OTHER): Payer: Medicare PPO

## 2021-12-07 ENCOUNTER — Encounter: Payer: Self-pay | Admitting: Family Medicine

## 2021-12-07 VITALS — BP 125/72 | HR 74 | Temp 97.2°F | Ht 64.0 in | Wt 142.0 lb

## 2021-12-07 DIAGNOSIS — I10 Essential (primary) hypertension: Secondary | ICD-10-CM | POA: Diagnosis not present

## 2021-12-07 DIAGNOSIS — M8589 Other specified disorders of bone density and structure, multiple sites: Secondary | ICD-10-CM | POA: Diagnosis not present

## 2021-12-07 DIAGNOSIS — Z130 Encounter for screening for diseases of the blood and blood-forming organs and certain disorders involving the immune mechanism: Secondary | ICD-10-CM | POA: Diagnosis not present

## 2021-12-07 DIAGNOSIS — E7800 Pure hypercholesterolemia, unspecified: Secondary | ICD-10-CM

## 2021-12-07 DIAGNOSIS — M8588 Other specified disorders of bone density and structure, other site: Secondary | ICD-10-CM

## 2021-12-07 DIAGNOSIS — Z87892 Personal history of anaphylaxis: Secondary | ICD-10-CM

## 2021-12-07 MED ORDER — EPINEPHRINE 0.3 MG/0.3ML IJ SOAJ: 0.3000 mg | INTRAMUSCULAR | 0 refills | Status: DC | PRN

## 2021-12-07 MED ORDER — LIVALO 4 MG PO TABS: 1.0000 | ORAL_TABLET | Freq: Every day | ORAL | 3 refills | Status: DC

## 2021-12-07 MED ORDER — QUINAPRIL HCL 20 MG PO TABS: 20.0000 mg | ORAL_TABLET | Freq: Every day | ORAL | 3 refills | Status: DC

## 2021-12-07 NOTE — Progress Notes (Signed)
Subjective: CC: Chronic follow-up PCP: Janora Norlander, DO Candice Beck is a 71 y.o. female presenting to clinic today for:  1.  Hypertension associate with hyperlipidemia Patient is compliant with Accupril 20 mg daily, Livalo 4 mg daily.  She denies any chest pain, shortness of breath, lower extremity edema.  Needs refills on both.  ROS: Per HPI  No Known Allergies Past Medical History:  Diagnosis Date   Hyperlipidemia    Hypertension    Vitamin D deficiency     Current Outpatient Medications:    cholecalciferol (VITAMIN D) 1000 UNITS tablet, Take 5,000 Units by mouth daily. , Disp: , Rfl:    Coenzyme Q10 (CO Q 10 PO), Take 1 tablet by mouth daily., Disp: , Rfl:    valACYclovir (VALTREX) 1000 MG tablet, Take 1 tablet (1,000 mg total) by mouth daily., Disp: 90 tablet, Rfl: 1   EPINEPHrine 0.3 mg/0.3 mL IJ SOAJ injection, Inject 0.3 mg into the muscle as needed (for anaphylactic / allergic reaction. 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  Tobacco Use   Smoking status: Never   Smokeless tobacco: Never  Vaping Use   Vaping Use: Never used  Substance and Sexual Activity   Alcohol use: Never   Drug use: No   Sexual activity: Yes  Other Topics Concern   Not on file  Social History Narrative   Lives home with her husband - daughter lives next door, son in [place], [place]   Enjoys working in her garden and walking her dog   Social Determinants of  Health   Financial Resource Strain: [place]  (12/07/2020)   Overall Financial Resource Strain (CARDIA)    Difficulty of Paying Living Expenses: Not hard at [place]: No [place] (12/07/2020)   Hunger Vital Sign    Worried About Running Out of Food in the Last Year: Never true    [place] in the Last Year: Never true  Transportation Needs: No Transportation Needs (12/07/2020)   PRAPARE - Hydrologist (Medical): No    Lack of Transportation (Non-Medical): No  Physical Activity: Sufficiently Active (12/07/2020)   Exercise Vital Sign    Days of Exercise per Week: 7 days    Minutes of Exercise per Session: 30 min  Stress: No Stress Concern Present (12/07/2020)   [place]    Feeling of Stress : Not at all  Social Connections: [place] (12/07/2020)   Social Connection and Isolation Panel [NHANES]    Frequency of Communication with Friends and Family: More than three times a week    Frequency of Social Gatherings with Friends and Family: Twice a week    Attends Religious Services: More than 4 times per year    Active Member of [employer] or Organizations: Yes  Attends Archivist Meetings: More than 4 times per year    Marital Status: Married  Human resources officer Violence: Not At Risk (12/07/2020)   Humiliation, Afraid, Rape, and Kick questionnaire    Fear of Current or Ex-Partner: No    Emotionally Abused: No    Physically Abused: No    Sexually Abused: No   Family History  Problem Relation Age of Onset   Cancer Mother        breast    Heart disease Mother        aortic valve replaced x2   Osteoporosis Mother    Cancer Father        throat   Heart disease Brother        heart attack age 7   Diabetes Brother        due to weight   Hypertension Brother    Hypertension Brother    Cancer Maternal Aunt        breast    Objective: Office vital  signs reviewed. BP 125/72   Pulse 74   Temp (!) 97.2 F (36.2 C)   Ht _0  (1.626 m)   Wt 142 lb (64.4 kg)   SpO2 98%   BMI 24.37 kg/m   Physical Examination:  General: Awake, alert, well nourished, No acute distress HEENT: Sclera white.  Moist mucous membranes Cardio: regular rate and rhythm, S1S2 heard, no murmurs appreciated Pulm: clear to auscultation bilaterally, no wheezes, rhonchi or rales; normal work of breathing on room air Extremities: warm, well perfused, No edema, cyanosis or clubbing; +2 pulses bilaterally   Assessment/ Plan: 71 y.o. female   Essential hypertension - Plan: CMP14+EGFR, quinapril (ACCUPRIL) 20 MG tablet  Pure hypercholesterolemia - Plan: CMP14+EGFR, Lipid Panel, TSH, Pitavastatin Calcium (LIVALO) 4 MG TABS  Screening, anemia, deficiency, iron - Plan: CBC  Osteopenia of multiple sites - Plan: DG [facility] DEXA  History of anaphylaxis - Plan: EPINEPHrine 0.3 mg/0.3 mL IJ SOAJ injection  Blood pressures well controlled.  No changes.  Refills have been sent.  Fasting labs ordered  Screening CBC and DEXA scan ordered  EpiPen renewed as she believes hers is out up-to-date  Orders Placed This Encounter  Procedures   DG [facility] DEXA    Standing Status:   Future    Number of Occurrences:   1    Standing Expiration Date:   12/08/2022    Order Specific Question:   Reason for Exam (SYMPTOM  OR DIAGNOSIS REQUIRED)    Answer:   screen osteoporosis   CMP14+EGFR   Lipid Panel   TSH   CBC   Meds ordered this encounter  Medications   quinapril (ACCUPRIL) 20 MG tablet    Sig: Take 1 tablet (20 mg total) by mouth daily.    Dispense:  90 tablet    Refill:  3   EPINEPHrine 0.3 mg/0.3 mL IJ SOAJ injection    Sig: Inject 0.3 mg into the muscle as needed (for anaphylactic / allergic reaction. Then call 911).    Dispense:  1 each    Refill:  0    Place on file   Pitavastatin Calcium (LIVALO) 4 MG TABS    Sig: Take 1 tablet (4 mg total) by mouth daily.     Dispense:  90 tablet    Refill:  [place], [place] [phone]

## 2021-12-07 NOTE — Patient Instructions (Signed)
[name] , Thank you for taking time to come for your Medicare Wellness Visit. I appreciate your ongoing commitment to your health goals. Please review the following plan we discussed and let me know if I can assist you in the future.   Screening recommendations/referrals: Colonoscopy: Done 12/27/2016 - Repeat in 10 years  Mammogram: Done 04/22/2021 - Repeat annually Bone Density: Done 12/07/2021 - Repeat every 2 years  Recommended yearly ophthalmology/optometry visit for glaucoma screening and checkup Recommended yearly dental visit for hygiene and checkup  Vaccinations: Influenza vaccine: Declined - recommended every fall Pneumococcal vaccine: Declined - recommend once per lifetime Prevnar-20 Tdap vaccine: Done 09/29/2011 - Repeat in 10 years *due Shingles vaccine: Zostavax done 2013 - Recommend Shingrix which is 2 doses 2-6 months apart and over 90% effective     Covid-19: Done 08/22/2019, 09/19/2019, & 05/20/2020  Advanced directives: Please bring a copy of your health care power of attorney and living will to the office to be added to your chart at your convenience.   Conditions/risks identified: Aim for 30 minutes of exercise or brisk walking, 6-8 glasses of water, and 5 servings of fruits and vegetables each day. Review the end of this summary for cholesterol content in foods.  Next appointment: Follow up in one year for your annual wellness visit    Preventive Care 65 Years and Older, Female Preventive care refers to lifestyle choices and visits with your health care provider that can promote health and wellness. What does preventive care include? A yearly physical exam. This is also called an annual well check. Dental exams once or twice a year. Routine eye exams. Ask your health care provider how often you should have your eyes checked. Personal lifestyle choices, including: Daily care of your teeth and gums. Regular physical activity. Eating a healthy diet. Avoiding tobacco and  drug use. Limiting alcohol use. Practicing safe sex. Taking low-dose aspirin every day. Taking vitamin and mineral supplements as recommended by your health care provider. What happens during an annual well check? The services and screenings done by your health care provider during your annual well check will depend on your age, overall health, lifestyle risk factors, and family history of disease. Counseling  Your health care provider may ask you questions about your: Alcohol use. Tobacco use. Drug use. Emotional well-being. Home and relationship well-being. Sexual activity. Eating habits. History of falls. Memory and ability to understand (cognition). Work and work Astronomer. Reproductive health. Screening  You may have the following tests or measurements: Height, weight, and BMI. Blood pressure. Lipid and cholesterol levels. These may be checked every 5 years, or more frequently if you are over 59 years old. Skin check. Lung cancer screening. You may have this screening every year starting at age 61 if you have a 30-pack-year history of smoking and currently smoke or have quit within the past 15 years. Fecal occult blood test (FOBT) of the stool. You may have this test every year starting at age 30. Flexible sigmoidoscopy or colonoscopy. You may have a sigmoidoscopy every 5 years or a colonoscopy every 10 years starting at age 73. Hepatitis C blood test. Hepatitis B blood test. Sexually transmitted disease (STD) testing. Diabetes screening. This is done by checking your blood sugar (glucose) after you have not eaten for a while (fasting). You may have this done every 1-3 years. Bone density scan. This is done to screen for osteoporosis. You may have this done starting at age 45. Mammogram. This may be done every  1-2 years. Talk to your health care provider about how often you should have regular mammograms. Talk with your health care provider about your test results, treatment  options, and if necessary, the need for more tests. Vaccines  Your health care provider may recommend certain vaccines, such as: Influenza vaccine. This is recommended every year. Tetanus, diphtheria, and acellular pertussis (Tdap, Td) vaccine. You may need a Td booster every 10 years. Zoster vaccine. You may need this after age 94. Pneumococcal 13-valent conjugate (PCV13) vaccine. One dose is recommended after age 18. Pneumococcal polysaccharide (PPSV23) vaccine. One dose is recommended after age 61. Talk to your health care provider about which screenings and vaccines you need and how often you need them. This information is not intended to replace advice given to you by your health care provider. Make sure you discuss any questions you have with your health care provider. Document Released: 07/03/2015 Document Revised: 02/24/2016 Document Reviewed: 04/07/2015 Elsevier Interactive Patient Education  2017 [employer].  Fall Prevention in the Home Falls can cause injuries. They can happen to people of all ages. There are many things you can do to make your home safe and to help prevent falls. What can I do on the outside of my home? Regularly fix the edges of walkways and driveways and fix any cracks. Remove anything that might make you trip as you walk through a door, such as a raised step or threshold. Trim any bushes or trees on the path to your home. Use bright outdoor lighting. Clear any walking paths of anything that might make someone trip, such as rocks or tools. Regularly check to see if handrails are loose or broken. Make sure that both sides of any steps have handrails. Any raised decks and porches should have guardrails on the edges. Have any leaves, snow, or ice cleared regularly. Use sand or salt on walking paths during winter. Clean up any spills in your garage right away. This includes oil or grease spills. What can I do in the bathroom? Use night lights. Install grab  bars by the toilet and in the tub and shower. Do not use towel bars as grab bars. Use non-skid mats or decals in the tub or shower. If you need to sit down in the shower, use a plastic, non-slip stool. Keep the floor dry. Clean up any water that spills on the floor as soon as it happens. Remove soap buildup in the tub or shower regularly. Attach bath mats securely with double-sided non-slip rug tape. Do not have throw rugs and other things on the floor that can make you trip. What can I do in the bedroom? Use night lights. Make sure that you have a light by your bed that is easy to reach. Do not use any sheets or blankets that are too big for your bed. They should not hang down onto the floor. Have a firm chair that has side arms. You can use this for support while you get dressed. Do not have throw rugs and other things on the floor that can make you trip. What can I do in the kitchen? Clean up any spills right away. Avoid walking on wet floors. Keep items that you use a lot in easy-to-reach places. If you need to reach something above you, use a strong step stool that has a grab bar. Keep electrical cords out of the way. Do not use floor polish or wax that makes floors slippery. If you must use wax, use non-skid  floor wax. Do not have throw rugs and other things on the floor that can make you trip. What can I do with my stairs? Do not leave any items on the stairs. Make sure that there are handrails on both sides of the stairs and use them. Fix handrails that are broken or loose. Make sure that handrails are as long as the stairways. Check any carpeting to make sure that it is firmly attached to the stairs. Fix any carpet that is loose or worn. Avoid having throw rugs at the top or bottom of the stairs. If you do have throw rugs, attach them to the floor with carpet tape. Make sure that you have a light switch at the top of the stairs and the bottom of the stairs. If you do not have them,  ask someone to add them for you. What else can I do to help prevent falls? Wear shoes that: Do not have high heels. Have rubber bottoms. Are comfortable and fit you well. Are closed at the toe. Do not wear sandals. If you use a stepladder: Make sure that it is fully opened. Do not climb a closed stepladder. Make sure that both sides of the stepladder are locked into place. Ask someone to hold it for you, if possible. Clearly mark and make sure that you can see: Any grab bars or handrails. First and last steps. Where the edge of each step is. Use tools that help you move around (mobility aids) if they are needed. These include: Canes. Walkers. Scooters. Crutches. Turn on the lights when you go into a dark area. Replace any light bulbs as soon as they burn out. Set up your furniture so you have a clear path. Avoid moving your furniture around. If any of your floors are uneven, fix them. If there are any pets around you, be aware of where they are. Review your medicines with your doctor. Some medicines can make you feel dizzy. This can increase your chance of falling. Ask your doctor what other things that you can do to help prevent falls. This information is not intended to replace advice given to you by your health care provider. Make sure you discuss any questions you have with your health care provider. Document Released: 04/02/2009 Document Revised: 11/12/2015 Document Reviewed: 07/11/2014 Elsevier Interactive Patient Education  2017 [employer]   Cholesterol Content in Foods Cholesterol is a waxy, fat-like substance that helps to carry fat in the blood. The body needs cholesterol in small amounts, but too much cholesterol can cause damage to the arteries and heart. What foods have cholesterol?  Cholesterol is found in animal-based foods, such as meat, seafood, and dairy. Generally, low-fat dairy and lean meats have less cholesterol than full-fat dairy and fatty meats. The  milligrams of cholesterol per serving (mg per serving) of common cholesterol-containing foods are listed below. Meats and other proteins Egg -- one large whole egg has 186 mg. Veal shank -- 4 oz (113 g) has 141 mg. Lean ground [place] (93% lean) -- 4 oz (113 g) has 118 mg. Fat-trimmed lamb loin -- 4 oz (113 g) has 106 mg. Lean ground beef (90% lean) -- 4 oz (113 g) has 100 mg. Lobster -- 3.5 oz (99 g) has 90 mg. Pork loin chops -- 4 oz (113 g) has 86 mg. Canned salmon -- 3.5 oz (99 g) has 83 mg. Fat-trimmed beef top loin -- 4 oz (113 g) has 78 mg. Frankfurter -- 1 frank (3.5 oz or  99 g) has 77 mg. Crab -- 3.5 oz (99 g) has 71 mg. Roasted chicken without skin, white meat -- 4 oz (113 g) has 66 mg. Light bologna -- 2 oz (57 g) has 45 mg. Deli-cut [place] -- 2 oz (57 g) has 31 mg. Canned tuna -- 3.5 oz (99 g) has 31 mg. Tomasa Blase -- 1 oz (28 g) has 29 mg. Oysters and mussels (raw) -- 3.5 oz (99 g) has 25 mg. Mackerel -- 1 oz (28 g) has 22 mg. Trout -- 1 oz (28 g) has 20 mg. Pork sausage -- 1 link (1 oz or 28 g) has 17 mg. Salmon -- 1 oz (28 g) has 16 mg. Tilapia -- 1 oz (28 g) has 14 mg. Dairy Soft-serve ice cream --  cup (4 oz or 86 g) has 103 mg. Whole-milk yogurt -- 1 cup (8 oz or 245 g) has 29 mg. Cheddar cheese -- 1 oz (28 g) has 28 mg. American cheese -- 1 oz (28 g) has 28 mg. Whole milk -- 1 cup (8 oz or 250 mL) has 23 mg. 2% milk -- 1 cup (8 oz or 250 mL) has 18 mg. Cream cheese -- 1 tablespoon (Tbsp) (14.5 g) has 15 mg. Cottage cheese --  cup (4 oz or 113 g) has 14 mg. Low-fat (1%) milk -- 1 cup (8 oz or 250 mL) has 10 mg. Sour cream -- 1 Tbsp (12 g) has 8.5 mg. Low-fat yogurt -- 1 cup (8 oz or 245 g) has 8 mg. Nonfat Greek yogurt -- 1 cup (8 oz or 228 g) has 7 mg. Half-and-half cream -- 1 Tbsp (15 mL) has 5 mg. Fats and oils Cod liver oil -- 1 tablespoon (Tbsp) (13.6 g) has 82 mg. Butter -- 1 Tbsp (14 g) has 15 mg. Lard -- 1 Tbsp (12.8 g) has 14 mg. Bacon grease -- 1 Tbsp  (12.9 g) has 14 mg. Mayonnaise -- 1 Tbsp (13.8 g) has 5-10 mg. Margarine -- 1 Tbsp (14 g) has 3-10 mg. The items listed above may not be a complete list of foods with cholesterol. Exact amounts of cholesterol in these foods may vary depending on specific ingredients and brands. Contact a dietitian for more information. What foods do not have cholesterol? Most plant-based foods do not have cholesterol unless you combine them with a food that has cholesterol. Foods without cholesterol include: Grains and cereals. Vegetables. Fruits. Vegetable oils, such as olive, canola, and sunflower oil. Legumes, such as peas, beans, and lentils. Nuts and seeds. Egg whites. The items listed above may not be a complete list of foods that do not have cholesterol. Contact a dietitian for more information. Summary The body needs cholesterol in small amounts, but too much cholesterol can cause damage to the arteries and heart. Cholesterol is found in animal-based foods, such as meat, seafood, and dairy. Generally, low-fat dairy and lean meats have less cholesterol than full-fat dairy and fatty meats. This information is not intended to replace advice given to you by your health care provider. Make sure you discuss any questions you have with your health care provider. Document Revised: 10/16/2020 Document Reviewed: 10/16/2020 Elsevier Patient Education  2023 [employer].

## 2021-12-07 NOTE — Patient Instructions (Signed)
Ear Barotrauma, Adult  Ear barotrauma is an injury to the middle ear. This happens when there is a change in air pressure. What are the causes? Flying in an airplane. Going deeper or coming to the surface too quickly when scuba diving. Going to a high place (high altitude) quickly. Being too close to an explosion or blast. What increases the risk? An infection in your middle ear. A sinus infection. A cold. An allergy to something around you. Having small tubes that lead from the middle ear to the back of the nose (eustachian tubes). Recent ear surgery. What are the signs or symptoms? Symptoms of this condition include: Ear pain. Sudden loss of hearing. This may be partial or complete. A feeling that your ear is clogged. Ringing in your ears. Other symptoms may include: Dizziness that creates a spinning, rocking, or tumbling feeling. Loss of balance. Feeling like you may vomit (nausea). Headache. Pain in your face. How is this treated? Medicines. Using a method to "pop" your ears or make the pressure in the middle ear the same as in the outer ear. Ways to do this include: Yawning. Chewing gum. Swallowing. Holding your nose closed and gently blowing. Surgery. This is done in very bad cases. Follow these instructions at home: Medicines Take over-the-counter and prescription medicines only as told by your doctor. Do not put anything into your ears to clean or unplug them. Activity Until your doctor says it is okay: Do not travel to high places. Do not fly in an airplane. Do not scuba dive. How is this prevented? Avoid doing things that cause pressure changes when you have symptoms of a cold or a stuffy nose. If you have a stuffy nose and will be flying, use a medicine that helps a stuffy nose (nasal decongestant) about 30-60 minutes before flying. When you fly: Chew gum. Swallow often and swallow hard during takeoff and landing. Hold your nose closed and gently blow to  "pop" your ears. Yawn during air pressure changes. If scuba diving, dive feet first and "pop" your ears often as you go deeper in the water. Contact a doctor if: Your symptoms get worse. Your symptoms do not get better. You have new symptoms. You have a fever. Get help right away if: You have very bad ear pain. You have a very bad headache. You are very dizzy. You have blood or pus coming from your ear. You have very bad balance problems. You cannot move or feel part of your face. Summary Ear barotrauma is an injury to the middle ear. This condition may be treated with medicines or by "popping" your ears. You can take steps to help prevent this condition. This information is not intended to replace advice given to you by your health care provider. Make sure you discuss any questions you have with your health care provider. Document Revised: 09/16/2019 Document Reviewed: 09/16/2019 Elsevier Patient Education  2023 [employer]  

## 2021-12-08 ENCOUNTER — Ambulatory Visit (INDEPENDENT_AMBULATORY_CARE_PROVIDER_SITE_OTHER): Payer: Medicare PPO

## 2021-12-08 VITALS — Wt 142.0 lb

## 2021-12-08 DIAGNOSIS — M8589 Other specified disorders of bone density and structure, multiple sites: Secondary | ICD-10-CM | POA: Diagnosis not present

## 2021-12-08 DIAGNOSIS — Z780 Asymptomatic menopausal state: Secondary | ICD-10-CM | POA: Diagnosis not present

## 2021-12-08 DIAGNOSIS — Z0000 Encounter for general adult medical examination without abnormal findings: Secondary | ICD-10-CM

## 2021-12-08 LAB — CMP14+EGFR
ALT: 15 IU/L (ref 0–32)
AST: 17 IU/L (ref 0–40)
Albumin/Globulin Ratio: 1.9 (ref 1.2–2.2)
Albumin: 4.5 g/dL (ref 3.8–4.8)
Alkaline Phosphatase: 94 IU/L (ref 44–121)
BUN/Creatinine Ratio: 14 (ref 12–28)
BUN: 12 mg/dL (ref 8–27)
Bilirubin Total: 0.5 mg/dL (ref 0.0–1.2)
CO2: 24 mmol/L (ref 20–29)
Calcium: 9.6 mg/dL (ref 8.7–10.3)
Chloride: 104 mmol/L (ref 96–106)
Creatinine, Ser: 0.84 mg/dL (ref 0.57–1.00)
Globulin, Total: 2.4 g/dL (ref 1.5–4.5)
Glucose: 95 mg/dL (ref 70–99)
Potassium: 3.9 mmol/L (ref 3.5–5.2)
Sodium: 142 mmol/L (ref 134–144)
Total Protein: 6.9 g/dL (ref 6.0–8.5)
eGFR: 75 mL/min/{1.73_m2} (ref >59)

## 2021-12-08 LAB — LIPID PANEL
Chol/HDL Ratio: 4.3 ratio (ref 0.0–4.4)
Cholesterol, Total: 188 mg/dL (ref 100–199)
HDL: 44 mg/dL (ref >39)
LDL Chol Calc (NIH): 115 mg/dL — ABNORMAL HIGH (ref 0–99)
Triglycerides: 165 mg/dL — ABNORMAL HIGH (ref 0–149)
VLDL Cholesterol Cal: 29 mg/dL (ref 5–40)

## 2021-12-08 LAB — CBC
Hematocrit: 40.5 % (ref 34.0–46.6)
Hemoglobin: 13.7 g/dL (ref 11.1–15.9)
MCH: 29.5 pg (ref 26.6–33.0)
MCHC: 33.8 g/dL (ref 31.5–35.7)
MCV: 87 fL (ref 79–97)
Platelets: 319 10*3/uL (ref 150–450)
RBC: 4.64 x10E6/uL (ref 3.77–5.28)
RDW: 13.6 % (ref 11.7–15.4)
WBC: 7.5 10*3/uL (ref 3.4–10.8)

## 2021-12-08 LAB — TSH: TSH: 1.55 u[IU]/mL (ref 0.450–4.500)

## 2021-12-08 NOTE — Progress Notes (Signed)
Subjective:   Candice Beck is a 71 y.o. female who presents for Medicare Annual (Subsequent) preventive examination.  Virtual Visit via Telephone Note  I connected with  Candice Beck on 12/08/21 at  9:45 AM EDT by telephone and verified that I am speaking with the correct person using two identifiers.  Location: Patient: Home Provider: [facility] Persons participating in the virtual visit: patient/Nurse Health Advisor   I discussed the limitations, risks, security and privacy concerns of performing an evaluation and management service by telephone and the availability of in person appointments. The patient expressed understanding and agreed to proceed.  Interactive audio and video telecommunications were attempted between this nurse and patient, however failed, due to patient having technical difficulties OR patient did not have access to video capability.  We continued and completed visit with audio only.  Some vital signs may be absent or patient reported.   Candice Beck E Candice Sivak, LPN   Review of Systems     Cardiac Risk Factors include: advanced age (>22men, >2 women);dyslipidemia;hypertension     Objective:    Today's Vitals   12/08/21 0950  Weight: 142 lb (64.4 kg)   Body mass index is 24.37 kg/m.     12/08/2021    9:55 AM 12/07/2020    9:55 AM 11/22/2018    3:19 PM  Advanced Directives  Does Patient Have a Medical Advance Directive? Yes No No  Type of Estate agent of [place];Living will    Copy of Healthcare Power of Attorney in Chart? No - copy requested    Would patient like information on creating a medical advance directive?  Yes (MAU/Ambulatory/Procedural Areas - Information given) Yes (MAU/Ambulatory/Procedural Areas - Information given)    Current Medications (verified) Outpatient Encounter Medications as of 12/08/2021  Medication Sig   cholecalciferol (VITAMIN D) 1000 UNITS tablet Take 5,000 Units by mouth daily.    Coenzyme Q10 (CO Q 10  PO) Take 1 tablet by mouth daily.   EPINEPHrine 0.3 mg/0.3 mL IJ SOAJ injection Inject 0.3 mg into the muscle as needed (for anaphylactic / allergic reaction. Then call 911).   Pitavastatin Calcium (LIVALO) 4 MG TABS Take 1 tablet (4 mg total) by mouth daily.   quinapril (ACCUPRIL) 20 MG tablet Take 1 tablet (20 mg total) by mouth daily.   valACYclovir (VALTREX) 1000 MG tablet Take 1 tablet (1,000 mg total) by mouth daily.   [DISCONTINUED] EPINEPHrine 0.3 mg/0.3 mL IJ SOAJ injection Inject 0.3 mLs (0.3 mg total) into the muscle as needed (for anaphylactic / allergic reaction).   [DISCONTINUED] LIVALO 4 MG TABS TAKE 1 TABLET BY MOUTH DAILY.   [DISCONTINUED] quinapril (ACCUPRIL) 20 MG tablet TAKE 1 TABLET (20 MG TOTAL) BY MOUTH DAILY. (NEEDS TO BE SEEN BEFORE NEXT REFILL)   No facility-administered encounter medications on file as of 12/08/2021.    Allergies (verified) Patient has no known allergies.   History: Past Medical History:  Diagnosis Date   Hyperlipidemia    Hypertension    Vitamin D deficiency    Past Surgical History:  Procedure Laterality Date   TONSILLECTOMY     Family History  Problem Relation Age of Onset   Cancer Mother        breast    Heart disease Mother        aortic valve replaced x2   Osteoporosis Mother    Cancer Father        throat   Heart disease Brother  heart attack age 69   Diabetes Brother        due to weight   Hypertension Brother    Hypertension Brother    Cancer Maternal Aunt        breast   Social History   Socioeconomic History   Marital status: Married    Spouse name: Candice Beck   Number of children: 2   Years of education: 14   Highest education level: Not on file  Occupational History   Occupation: Retired    Associate Professormployer: [employer]    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  Tobacco Use   Smoking status: Never   Smokeless tobacco: Never  Vaping Use   Vaping Use: Never used  Substance and Sexual Activity    Alcohol use: Never   Drug use: No   Sexual activity: Yes  Other Topics Concern   Not on file  Social History Narrative   Lives home with her husband - daughter [place] next door, [place]   Enjoys working in her garden and walking her dog   Social Determinants of Health   Financial Resource Strain: Low Risk  (12/08/2021)   Overall Financial Resource Strain (CARDIA)    Difficulty of Paying Living Expenses: Not hard at all  Food Insecurity: No Food Insecurity (12/08/2021)   Hunger Vital Sign    Worried About Running Out of Food in the Last Year: Never true    Ran Out of Food in the Last Year: Never true  Transportation Needs: No Transportation Needs (12/08/2021)   PRAPARE - Administrator, Civil ServiceTransportation    Lack of Transportation (Medical): No    Lack of Transportation (Non-Medical): No  Physical Activity: Sufficiently Active (12/08/2021)   Exercise Vital Sign    Days of Exercise per Week: 7 days    Minutes of Exercise per Session: 30 min  Stress: No Stress Concern Present (12/08/2021)   [employer] - Occupational Stress Questionnaire    Feeling of Stress : Not at all  Social Connections: Socially Integrated (12/08/2021)   Social Connection and Isolation Panel [NHANES]    Frequency of Communication with Friends and Family: More than three times a week    Frequency of Social Gatherings with Friends and Family: Twice a week    Attends Religious Services: More than 4 times per year    Active Member of [employer]ubs or Organizations: Yes    Attends Engineer, structuralClub or Organization Meetings: More than 4 times per year    Marital Status: Married    Tobacco Counseling Counseling given: Not Answered   Clinical Intake:  Pre-visit preparation completed: Yes  Pain : No/denies pain     BMI - recorded: 24.37 Nutritional Status: BMI of 19-24  Normal Nutritional Risks: None Diabetes: No  How often do you need to have someone help you when you read instructions, pamphlets, or  other written materials from your doctor or pharmacy?: 1 - Never  Diabetic? no  Interpreter Needed?: No  Information entered by :: Mert Dietrick, LPN   Activities of Daily Living    12/08/2021    9:55 AM  In your present state of health, do you have any difficulty performing the following activities:  Hearing? 0  Vision? 0  Difficulty concentrating or making decisions? 0  Walking or climbing stairs? 0  Dressing or bathing? 0  Doing errands, shopping? 0  Preparing Food and eating ? N  Using the Toilet? N  In the past six months, have  you accidently leaked urine? N  Do you have problems with loss of bowel control? N  Managing your Medications? N  Managing your Finances? N  Housekeeping or managing your Housekeeping? N    Patient Care Team: Raliegh Ip, DO as PCP - General (Family Medicine)  Indicate any recent Medical Services you may have received from other than [facility] providers in the past year (date may be approximate).     Assessment:   This is a routine wellness examination for Shiane.  Hearing/Vision screen Hearing Screening - Comments:: Denies hearing difficulties   Vision Screening - Comments:: Wears otc readers prn only - up to date with routine eye exams with [facility]  Dietary issues and exercise activities discussed: Current Exercise Habits: Home exercise routine, Type of exercise: walking;Other - see comments, Time (Minutes): 30, Frequency (Times/Week): 7, Weekly Exercise (Minutes/Week): 210, Intensity: Mild, Exercise limited by: orthopedic condition(s)   Goals Addressed             This Visit's Progress    Increase physical activity   On track    Walk at least times per week for 30 minutes each session.        Depression Screen    12/08/2021    9:53 AM 12/07/2021   11:18 AM 02/04/2021   11:02 AM 12/07/2020    9:54 AM 07/29/2020    2:58 PM 03/18/2020    8:12 AM 09/16/2019    8:21 AM  PHQ 2/9 Scores  PHQ - 2 Score 0 0 0 0 0 0 0  PHQ- 9 Score      0      Fall Risk    12/08/2021    9:51 AM 12/07/2021   11:18 AM 02/04/2021   11:02 AM 12/07/2020    9:55 AM 07/29/2020    2:58 PM  Fall Risk   Falls in the past year? 0 0 0 0 0  Number falls in past yr: 0   0   Injury with Fall? 0   0   Risk for fall due to : Orthopedic patient   Orthopedic patient   Follow up Falls prevention discussed   Falls prevention discussed     FALL RISK PREVENTION PERTAINING TO THE HOME:  Any stairs in or around the home? Yes  If so, are there any without handrails? No  Home free of loose throw rugs in walkways, pet beds, electrical cords, etc? Yes  Adequate lighting in your home to reduce risk of falls? Yes   ASSISTIVE DEVICES UTILIZED TO PREVENT FALLS:  Life alert? No  Use of a cane, Candice or w/c? No  Grab bars in the bathroom? No  Shower chair or bench in shower? Yes  Elevated toilet seat or a handicapped toilet? Yes   TIMED UP AND GO:  Was the test performed? No . Telephonic visit  Cognitive Function:    01/31/2017    8:59 AM  MMSE - Mini Mental State Exam  Orientation to time 5  Orientation to Place 5  Registration 3  Attention/ Calculation 5  Recall 3  Language- name 2 objects 2  Language- repeat 1  Language- follow 3 step command 3  Language- read & follow direction 1  Write a sentence 1  Copy design 1  Total score 30        11/22/2018    3:25 PM  6CIT Screen  What Year? 0 points  What month? 0 points  What time? 0 points  Count back  from 20 0 points  Months in reverse 0 points  Repeat phrase 0 points  Total Score 0 points    Immunizations Immunization History  Administered Date(s) Administered   Moderna Sars-Covid-2 Vaccination 08/22/2019, 09/19/2019, 05/20/2020   Tdap 09/29/2011   Zoster, Live 09/29/2011    TDAP status: Due, Education has been provided regarding the importance of this vaccine. Advised may receive this vaccine at local pharmacy or Health Dept. Aware to provide a copy of the vaccination record if  obtained from local pharmacy or Health Dept. Verbalized acceptance and understanding.  Flu Vaccine status: Declined, Education has been provided regarding the importance of this vaccine but patient still declined. Advised may receive this vaccine at local pharmacy or Health Dept. Aware to provide a copy of the vaccination record if obtained from local pharmacy or Health Dept. Verbalized acceptance and understanding.  Pneumococcal vaccine status: Declined,  Education has been provided regarding the importance of this vaccine but patient still declined. Advised may receive this vaccine at local pharmacy or Health Dept. Aware to provide a copy of the vaccination record if obtained from local pharmacy or Health Dept. Verbalized acceptance and understanding.   Covid-19 vaccine status: Completed vaccines  Qualifies for Shingles Vaccine? Yes   Zostavax completed Yes   Shingrix Completed?: No.    Education has been provided regarding the importance of this vaccine. Patient has been advised to call insurance company to determine out of pocket expense if they have not yet received this vaccine. Advised may also receive vaccine at local pharmacy or Health Dept. Verbalized acceptance and understanding.  Screening Tests Health Maintenance  Topic Date Due   DEXA SCAN  09/15/2021   COVID-19 Vaccine (4 - Moderna series) 12/23/2021 (Originally 07/15/2020)   Zoster Vaccines- Shingrix (1 of 2) 03/09/2022 (Originally 11/16/2002)   Pneumonia Vaccine 83+ Years old (1 - PCV) 12/08/2022 (Originally 11/15/2017)   TETANUS/TDAP  12/08/2022 (Originally 09/28/2021)   COLON CANCER SCREENING ANNUAL FOBT  12/24/2021   INFLUENZA VACCINE  01/18/2022   MAMMOGRAM  04/22/2022   COLONOSCOPY (Pts 45-35yrs Insurance coverage will need to be confirmed)  12/28/2026   Hepatitis C Screening  Completed   HPV VACCINES  Aged Out    Health Maintenance  Health Maintenance Due  Topic Date Due   DEXA SCAN  09/15/2021    Colorectal  cancer screening: Type of screening: Colonoscopy. Completed 12/27/2016. Repeat every 10 years  Mammogram status: Completed 04/22/2021. Repeat every year  Bone Density status: Completed 12/07/2021. Results reflect: Bone density results: OSTEOPENIA. Repeat every 2 years. No results yet, but last results from 2 years ago showed osteopenia  Lung Cancer Screening: (Low Dose CT Chest recommended if Age 37-80 years, 30 pack-year currently smoking OR have quit w/in 15years.) does not qualify.  Additional Screening:  Hepatitis C Screening: does qualify; Completed 05/26/2017  Vision Screening: Recommended annual ophthalmology exams for early detection of glaucoma and other disorders of the eye. Is the patient up to date with their annual eye exam?  Yes  Who is the provider or what is the name of the office in which the patient attends annual eye exams? [facility] If pt is not established with a provider, would they like to be referred to a provider to establish care? No .   Dental Screening: Recommended annual dental exams for proper oral hygiene  Community Resource Referral / Chronic Care Management: CRR required this visit?  No   CCM required this visit?  No      Plan:  I have personally reviewed and noted the following in the patient's chart:   Medical and social history Use of alcohol, tobacco or illicit drugs  Current medications and supplements including opioid prescriptions.  Functional ability and status Nutritional status Physical activity Advanced directives List of other physicians Hospitalizations, surgeries, and ER visits in previous 12 months Vitals Screenings to include cognitive, depression, and falls Referrals and appointments  In addition, I have reviewed and discussed with patient certain preventive protocols, quality metrics, and best practice recommendations. A written personalized care plan for preventive services as well as general preventive health recommendations  were provided to patient.     [place] Constable, LPN   4/32/[id number]   Nurse Notes: None

## 2021-12-09 ENCOUNTER — Telehealth: Payer: Self-pay | Admitting: Family Medicine

## 2021-12-09 DIAGNOSIS — I10 Essential (primary) hypertension: Secondary | ICD-10-CM

## 2021-12-09 MED ORDER — LISINOPRIL 20 MG PO TABS: 20.0000 mg | ORAL_TABLET | Freq: Every day | ORAL | 2 refills | Status: DC

## 2021-12-09 NOTE — Telephone Encounter (Signed)
Patient aware.

## 2021-12-09 NOTE — Progress Notes (Signed)
Seen by patient [name] on 12/09/2021 11:24 AM

## 2021-12-09 NOTE — Telephone Encounter (Signed)
I sent Lisinopril 20 mg. She can do a direct switch. Monitor BP due to the change.

## 2022-02-08 DIAGNOSIS — H2513 Age-related nuclear cataract, bilateral: Secondary | ICD-10-CM | POA: Diagnosis not present

## 2022-02-08 DIAGNOSIS — H04123 Dry eye syndrome of bilateral lacrimal glands: Secondary | ICD-10-CM | POA: Diagnosis not present

## 2022-02-08 DIAGNOSIS — H43812 Vitreous degeneration, left eye: Secondary | ICD-10-CM | POA: Diagnosis not present

## 2022-03-03 DIAGNOSIS — H04123 Dry eye syndrome of bilateral lacrimal glands: Secondary | ICD-10-CM | POA: Diagnosis not present

## 2022-03-03 DIAGNOSIS — H43812 Vitreous degeneration, left eye: Secondary | ICD-10-CM | POA: Diagnosis not present

## 2022-03-03 DIAGNOSIS — H2513 Age-related nuclear cataract, bilateral: Secondary | ICD-10-CM | POA: Diagnosis not present

## 2022-03-04 ENCOUNTER — Other Ambulatory Visit: Payer: Self-pay | Admitting: Family Medicine

## 2022-03-04 DIAGNOSIS — I10 Essential (primary) hypertension: Secondary | ICD-10-CM

## 2022-05-27 ENCOUNTER — Other Ambulatory Visit: Payer: Self-pay | Admitting: Family Medicine

## 2022-05-27 DIAGNOSIS — I10 Essential (primary) hypertension: Secondary | ICD-10-CM

## 2022-06-06 DIAGNOSIS — Z1231 Encounter for screening mammogram for malignant neoplasm of breast: Secondary | ICD-10-CM | POA: Diagnosis not present

## 2022-06-16 DIAGNOSIS — R928 Other abnormal and inconclusive findings on diagnostic imaging of breast: Secondary | ICD-10-CM | POA: Diagnosis not present

## 2022-06-20 ENCOUNTER — Encounter (HOSPITAL_BASED_OUTPATIENT_CLINIC_OR_DEPARTMENT_OTHER): Payer: Self-pay | Admitting: *Deleted

## 2022-06-20 ENCOUNTER — Other Ambulatory Visit: Payer: Self-pay

## 2022-06-20 ENCOUNTER — Emergency Department (HOSPITAL_BASED_OUTPATIENT_CLINIC_OR_DEPARTMENT_OTHER): Payer: Medicare PPO | Admitting: Radiology

## 2022-06-20 ENCOUNTER — Emergency Department (HOSPITAL_BASED_OUTPATIENT_CLINIC_OR_DEPARTMENT_OTHER)
Admission: EM | Admit: 2022-06-20 | Discharge: 2022-06-20 | Disposition: A | Discharge status: Home / Self Care | Payer: Medicare PPO | Attending: Emergency Medicine | Admitting: Emergency Medicine

## 2022-06-20 DIAGNOSIS — R062 Wheezing: Secondary | ICD-10-CM | POA: Diagnosis not present

## 2022-06-20 DIAGNOSIS — R509 Fever, unspecified: Secondary | ICD-10-CM | POA: Diagnosis not present

## 2022-06-20 DIAGNOSIS — R059 Cough, unspecified: Secondary | ICD-10-CM | POA: Diagnosis not present

## 2022-06-20 DIAGNOSIS — J189 Pneumonia, unspecified organism: Secondary | ICD-10-CM

## 2022-06-20 DIAGNOSIS — I10 Essential (primary) hypertension: Secondary | ICD-10-CM | POA: Insufficient documentation

## 2022-06-20 DIAGNOSIS — Z1152 Encounter for screening for COVID-19: Secondary | ICD-10-CM | POA: Insufficient documentation

## 2022-06-20 DIAGNOSIS — R051 Acute cough: Secondary | ICD-10-CM | POA: Diagnosis not present

## 2022-06-20 DIAGNOSIS — Z79899 Other long term (current) drug therapy: Secondary | ICD-10-CM | POA: Insufficient documentation

## 2022-06-20 DIAGNOSIS — J168 Pneumonia due to other specified infectious organisms: Secondary | ICD-10-CM | POA: Diagnosis not present

## 2022-06-20 LAB — BASIC METABOLIC PANEL
Anion gap: 10 (ref 5–15)
BUN: 12 mg/dL (ref 8–23)
CO2: 27 mmol/L (ref 22–32)
Calcium: 9.4 mg/dL (ref 8.9–10.3)
Chloride: 100 mmol/L (ref 98–111)
Creatinine, Ser: 0.69 mg/dL (ref 0.44–1.00)
GFR, Estimated: >60 mL/min (ref >60)
Glucose, Bld: 101 mg/dL — ABNORMAL HIGH (ref 70–99)
Potassium: 3.7 mmol/L (ref 3.5–5.1)
Sodium: 137 mmol/L (ref 135–145)

## 2022-06-20 LAB — RESP PANEL BY RT-PCR (RSV, FLU A&B, COVID)  RVPGX2
Influenza A by PCR: NEGATIVE
Influenza B by PCR: NEGATIVE
Resp Syncytial Virus by PCR: NEGATIVE
SARS Coronavirus 2 by RT PCR: NEGATIVE

## 2022-06-20 LAB — CBC
HCT: 38.1 % (ref 36.0–46.0)
Hemoglobin: 13.1 g/dL (ref 12.0–15.0)
MCH: 29.6 pg (ref 26.0–34.0)
MCHC: 34.4 g/dL (ref 30.0–36.0)
MCV: 86 fL (ref 80.0–100.0)
Platelets: 287 10*3/uL (ref 150–400)
RBC: 4.43 MIL/uL (ref 3.87–5.11)
RDW: 12.7 % (ref 11.5–15.5)
WBC: 8.8 10*3/uL (ref 4.0–10.5)
nRBC: 0 % (ref 0.0–0.2)

## 2022-06-20 LAB — BRAIN NATRIURETIC PEPTIDE: B Natriuretic Peptide: 44.9 pg/mL (ref 0.0–100.0)

## 2022-06-20 MED ORDER — AMOXICILLIN-POT CLAVULANATE 875-125 MG PO TABS: 1.0000 | ORAL_TABLET | Freq: Two times a day (BID) | ORAL | 0 refills | Status: DC

## 2022-06-20 MED ORDER — ACETAMINOPHEN 325 MG PO TABS: 650.0000 mg | ORAL_TABLET | Freq: Once | ORAL | Status: DC

## 2022-06-20 MED ORDER — ALBUTEROL SULFATE HFA 108 (90 BASE) MCG/ACT IN AERS
4.0000 | INHALATION_SPRAY | Freq: Once | RESPIRATORY_TRACT | Status: AC
  Administered 2022-06-20: 4 via RESPIRATORY_TRACT
  Filled 2022-06-20: qty 6.7

## 2022-06-20 MED ORDER — AMOXICILLIN-POT CLAVULANATE 875-125 MG PO TABS
1.0000 | ORAL_TABLET | Freq: Once | ORAL | Status: AC
  Administered 2022-06-20: 1 via ORAL
  Filled 2022-06-20: qty 1

## 2022-06-20 NOTE — ED Provider Notes (Signed)
[facility]   CSN: [id number] Arrival date & time: 06/20/22  [id number]     History  Chief Complaint  Patient presents with   Cough    Candice Beck is a 72 y.o. female with past medical history significant for hypertension, hyperlipidemia presents with concern for cough for around 12 days.  Patient reports that initially she had no fever, chills, shortness of breath, has not had any nausea, vomiting, lack of appetite.  Patient reports that she initially thought it could have been a lisinopril side effect as she began taking it just a month ago, and knew that it could be associate with some cough.,  She began taking lisinopril again on 12/29, started having worse cough.  Patient reports cough feels dry, and with attempted exhale.  She denies any previous history of heart attack, stroke, heart failure, she denies any leg swelling   Cough      Home Medications Prior to Admission medications   Medication Sig Start Date End Date Taking? Authorizing Provider  amoxicillin-clavulanate (AUGMENTIN) 875-125 MG tablet Take 1 tablet by mouth every 12 (twelve) hours. 06/20/22  Yes Marayah Higdon H, PA-C  cholecalciferol (VITAMIN D) 1000 UNITS tablet Take 5,000 Units by mouth daily.     [provider]  Coenzyme Q10 (CO Q 10 PO) Take 1 tablet by mouth daily.    [provider]  EPINEPHrine 0.3 mg/0.3 mL IJ SOAJ injection Inject 0.3 mg into the muscle as needed (for anaphylactic / allergic reaction. Then call 911). 12/07/21   Raliegh IpGottschalk, Ashly M, DO  lisinopril (ZESTRIL) 20 MG tablet Take 1 tablet (20 mg total) by mouth daily. (NEEDS TO BE SEEN BEFORE NEXT REFILL) 05/27/22   Raliegh IpGottschalk, Ashly M, DO  Pitavastatin Calcium (LIVALO) 4 MG TABS Take 1 tablet (4 mg total) by mouth daily. 12/07/21   Raliegh IpGottschalk, Ashly M, DO  valACYclovir (VALTREX) 1000 MG tablet Take 1 tablet (1,000 mg total) by mouth daily. 06/10/21   [name], Christy A, FNP       Allergies    Patient has no known allergies.    Review of Systems   Review of Systems  Respiratory:  Positive for cough.   All other systems reviewed and are negative.   Physical Exam Updated Vital Signs BP (!) 148/65 (BP Location: Right Arm)   Pulse (!) 114   Temp 98.1 F (36.7 C) (Oral)   Resp 16   SpO2 94%  Physical Exam Vitals and nursing note reviewed.  Constitutional:      General: She is not in acute distress.    Appearance: Normal appearance.  HENT:     Head: Normocephalic and atraumatic.  Eyes:     General:        Right eye: No discharge.        Left eye: No discharge.  Cardiovascular:     Rate and Rhythm: Normal rate and regular rhythm.     Heart sounds: No murmur heard.    No friction rub. No gallop.  Pulmonary:     Effort: Pulmonary effort is normal.     Breath sounds: Normal breath sounds.     Comments: Mild wheezing, bronchospasm in upper lung fields. Moderate rhonchi throughout. No focal consolidation appreciated Abdominal:     General: Bowel sounds are normal.     Palpations: Abdomen is soft.  Skin:    General: Skin is warm and dry.     Capillary Refill: Capillary refill takes less than 2  seconds.  Neurological:     Mental Status: She is alert and oriented to person, place, and time.  Psychiatric:        Mood and Affect: Mood normal.        Behavior: Behavior normal.     ED Results / Procedures / Treatments   Labs (all labs ordered are listed, but only abnormal results are displayed) Labs Reviewed  BASIC METABOLIC PANEL - Abnormal; Notable for the following components:      Result Value   Glucose, Bld 101 (*)    All other components within normal limits  RESP PANEL BY RT-PCR (RSV, FLU A&B, COVID)  [id number]  CBC  BRAIN NATRIURETIC PEPTIDE    EKG EKG Interpretation  Date/Time:  Monday June 20 2022 11:19:42 EST Ventricular Rate:  105 PR Interval:  166 QRS Duration: 66 QT Interval:  322 QTC Calculation: 426 R Axis:   31 Text  Interpretation: Sinus tachycardia Anterior infarct, old when comapred top rior, similar appearnce. No STEMI Confirmed by Antony Blackbird [phone]) on 06/20/2022 11:52:26 AM  Radiology DG Chest 2 View  Result Date: 06/20/2022 CLINICAL DATA:  Cough for 3 weeks fever, recent initiation of lisinopril EXAM: CHEST - 2 VIEW COMPARISON:  02/04/2021 FINDINGS: The heart size and mediastinal contours are within normal limits. Mild diffuse bilateral interstitial pulmonary opacity. The visualized skeletal structures are unremarkable. IMPRESSION: Mild diffuse bilateral interstitial pulmonary opacity, consistent with edema or infection. No focal airspace opacity. Electronically Signed   By: Delanna Ahmadi M.D.   On: 06/20/2022 10:13    Procedures Procedures    Medications Ordered in ED Medications  acetaminophen (TYLENOL) tablet 650 mg (650 mg Oral Not Given 06/20/22 1059)  albuterol (VENTOLIN HFA) 108 (90 Base) MCG/ACT inhaler 4 puff (4 puffs Inhalation Given 06/20/22 1142)  amoxicillin-clavulanate (AUGMENTIN) 875-125 MG per tablet 1 tablet (1 tablet Oral Given 06/20/22 1226)    ED Course/ Medical Decision Making/ A&P                           Medical Decision Making Amount and/or Complexity of Data Reviewed Labs: ordered. Radiology: ordered.  Risk OTC drugs.   This patient is a 72 y.o. female  who presents to the ED for concern of cough, without significant shortness of breath, general malaise.   Differential diagnoses prior to evaluation: The emergent differential diagnosis includes, but is not limited to,  asthma exacerbation, COPD exacerbation, acute upper respiratory infection, acute bronchitis, chronic bronchitis, interstitial lung disease, ARDS, PE, pneumonia, atypical ACS, carbon monoxide poisoning, spontaneous pneumothorax, new CHF vs CHF exacerbation, versus other, based on her symptoms I think that an early pneumonia versus upper respiratory infection is most likely at this time.   This is not an  exhaustive differential.   Past Medical History / Co-morbidities: Hypertension, hyperlipidemia  Additional history: Chart reviewed. Pertinent results include: Reviewed outpatient family medicine visits  Physical Exam: Physical exam performed. The pertinent findings include: Patient with some mild wheeze, bronchospasm, scattered rhonchi, no focal consolidation noted.  She does arrive with fever of 100.2, and has been tachycardic during her evaluation.  In respiratory distress, tachycardia I think likely secondary to her fever.   Lab Tests/Imaging studies: I personally interpreted labs/imaging and the pertinent results include: CBC unremarkable, BMP unremarkable, RVP negative for COVID, flu, RSV, BNP unremarkable.  No signs of acute heart failure exacerbation or new diagnosis.   Independently interpreted plain film chest x-ray which shows mild diffuse  interstitial opacity present with very small fluid overload from the infection.  I agree with the radiologist interpretation.  Cardiac monitoring: EKG obtained and interpreted by my attending physician which shows: Normal sinus rhythm, similar compared to previous.   Medications: I ordered medication including Wheezing, cough improved after albuterol, one-time dose of augmentin given, will discharge on Augmentin for 7 days..  I have reviewed the patients home medicines and have made adjustments as needed.   Disposition: After consideration of the diagnostic results and the patients response to treatment, I feel that patient's symptoms are consistent with an atypical pneumonia or early community-acquired pneumonia, will treat with Augmentin for 1 week, discharged with inhaler, and encouraged PCP follow-up if any ongoing cough may consider evaluation of whether it is caused by her lisinopril, but think that pneumonia is a more likely diagnosis at this time.   emergency department workup does not suggest an emergent condition requiring admission or  immediate intervention beyond what has been performed at this time. The plan is: as above. The patient is safe for discharge and has been instructed to return immediately for worsening symptoms, change in symptoms or any other concerns.  Final Clinical Impression(s) / ED Diagnoses Final diagnoses:  Pneumonia due to infectious organism, unspecified laterality, unspecified part of lung  Acute cough    Rx / DC Orders ED Discharge Orders          Ordered    amoxicillin-clavulanate (AUGMENTIN) 875-125 MG tablet  Every 12 hours        06/20/22 1213              Karon Heckendorn, [place] H, PA-C 06/20/22 1235    Tegeler, Gwenyth Allegra, MD 06/20/22 1530

## 2022-06-20 NOTE — ED Triage Notes (Addendum)
Pt has a cough x3 weeks.  Pt reports that she began lisinopril a month ago and initially thought it could be a side effect of the medication and she stopped taking it for 6 days and the cough got a little better but did not go away so she began taking the lisinopril again on Friday 12/29 and she feels like her cough got worse again.  Pt reports a fever with this this am of 1098F this am.  She did not take any meds pta and temp 100.98F in triage. She took a covid test last week which was negative. No sob with this.  Cough is dry. BP elevated in triage and pt reports she did not take lisinopril this am.

## 2022-06-20 NOTE — Discharge Instructions (Addendum)
As we discussed your symptoms are consistent with an early atypical flu.  You do not have COVID or the flu.  I encourage plenty of fluids, rest. You can use Tylenol for any fever, chills, ibuprofen for any body aches or headache. If you feel like you have sinus pressure congestion you can consider some over-the-counter saline nose spray, or a Nettie pot.  You can use over-the-counter Mucinex, or other cough and cold medication for congestion, sore throat, cough.  If your symptoms worsen, you develop significant shortness of breath or chest pain please return to the emergency department for further evaluation.  Take the entire course of antibiotics that you are given, if you are still having some persistent cough in the week following finishing your antibiotic course you may want to talk to your primary care doctor on whether or not there could be some contribution from your lisinopril.

## 2022-06-20 NOTE — ED Notes (Signed)
Discharge paperwork given and verbally. 

## 2022-06-20 NOTE — ED Notes (Signed)
RT note: Pt. educated on use of Albuterol Inhaler with/without Spacer device, tolerated well.

## 2022-06-20 NOTE — ED Notes (Signed)
Patient transported to X-ray 

## 2022-06-24 ENCOUNTER — Encounter: Payer: Self-pay | Admitting: Family Medicine

## 2022-06-24 ENCOUNTER — Telehealth: Payer: Self-pay | Admitting: *Deleted

## 2022-06-24 NOTE — Telephone Encounter (Signed)
        Patient  visited [facility] ed on 06/20/2022  for treatment   Telephone encounter attempt :  1st  Just rang   [facility] [phone] [address] , [place] [address] Email : [email or web address]

## 2022-06-27 ENCOUNTER — Other Ambulatory Visit: Payer: Self-pay | Admitting: Family Medicine

## 2022-06-27 DIAGNOSIS — I10 Essential (primary) hypertension: Secondary | ICD-10-CM

## 2022-06-28 NOTE — Telephone Encounter (Signed)
[place] NTBS 30 days given 05/27/22

## 2022-06-29 ENCOUNTER — Encounter: Payer: Self-pay | Admitting: Family Medicine

## 2022-06-29 ENCOUNTER — Telehealth (INDEPENDENT_AMBULATORY_CARE_PROVIDER_SITE_OTHER): Payer: Medicare PPO | Admitting: Family Medicine

## 2022-06-29 DIAGNOSIS — I10 Essential (primary) hypertension: Secondary | ICD-10-CM | POA: Diagnosis not present

## 2022-06-29 DIAGNOSIS — T464X5A Adverse effect of angiotensin-converting-enzyme inhibitors, initial encounter: Secondary | ICD-10-CM | POA: Diagnosis not present

## 2022-06-29 DIAGNOSIS — R058 Other specified cough: Secondary | ICD-10-CM | POA: Diagnosis not present

## 2022-06-29 MED ORDER — LOSARTAN POTASSIUM 50 MG PO TABS: 50.0000 mg | ORAL_TABLET | Freq: Every day | ORAL | 3 refills | Status: DC

## 2022-06-29 NOTE — Progress Notes (Signed)
Telephone visit  Subjective: OY:DXAJO PCP: Janora Norlander, DO INO:MVEHM R Point is a 72 y.o. female calls for telephone consult today. Patient provides verbal consent for consult held via phone.  Due to COVID-19 pandemic this visit was conducted virtually. This visit type was conducted due to national recommendations for restrictions regarding the COVID-19 Pandemic (e.g. social distancing, sheltering in place) in an effort to limit this patient's exposure and mitigate transmission in our community. All issues noted in this document were discussed and addressed.  A physical exam was not performed with this format.   Location of patient: home Location of provider: [facility] Others present for call: none  1. Cough Patient reports that she has had a cough x3 weeks.  She went to  on new year's and at that time she had a mild fever.  CXR negative, Viral cultures negative.  EKG normal.  She was placed on ABX and inhaler.  She has completed those meds but has not seen a large difference in symptoms. No hemoptysis. No wheezing.    ROS: Per HPI  No Known Allergies Past Medical History:  Diagnosis Date   Hyperlipidemia    Hypertension    Vitamin D deficiency     Current Outpatient Medications:    amoxicillin-clavulanate (AUGMENTIN) 875-125 MG tablet, Take 1 tablet by mouth every 12 (twelve) hours., Disp: 14 tablet, Rfl: 0   cholecalciferol (VITAMIN D) 1000 UNITS tablet, Take 5,000 Units by mouth daily. , Disp: , Rfl:    Coenzyme Q10 (CO Q 10 PO), Take 1 tablet by mouth daily., Disp: , Rfl:    EPINEPHrine 0.3 mg/0.3 mL IJ SOAJ injection, Inject 0.3 mg into the muscle as needed (for anaphylactic / allergic reaction. Then call 911)., Disp: 1 each, Rfl: 0   lisinopril (ZESTRIL) 20 MG tablet, Take 1 tablet (20 mg total) by mouth daily. (NEEDS TO BE SEEN BEFORE NEXT REFILL), Disp: 30 tablet, Rfl: 0   Pitavastatin Calcium (LIVALO) 4 MG TABS, Take 1 tablet (4 mg total) by mouth daily., Disp:  90 tablet, Rfl: 3   valACYclovir (VALTREX) 1000 MG tablet, Take 1 tablet (1,000 mg total) by mouth daily., Disp: 90 tablet, Rfl: 1  Assessment/ Plan: 72 y.o. female   Cough due to ACE inhibitor  Essential hypertension - Plan: losartan (COZAAR) 50 MG tablet  Change to losartan.  Ok to start with 25mg  and increase to 50mg  if BP >140/90.  Start time: 10:04a (LVM); 10:29am End time: 10:37am  Total time spent on patient care (including telephone call/ virtual visit): 8 minutes  [place], [place] [phone]

## 2022-07-13 ENCOUNTER — Other Ambulatory Visit: Payer: Self-pay | Admitting: Family

## 2022-07-13 DIAGNOSIS — B001 Herpesviral vesicular dermatitis: Secondary | ICD-10-CM

## 2022-10-12 ENCOUNTER — Other Ambulatory Visit: Payer: Self-pay | Admitting: Family Medicine

## 2022-10-12 DIAGNOSIS — B001 Herpesviral vesicular dermatitis: Secondary | ICD-10-CM

## 2022-11-28 ENCOUNTER — Ambulatory Visit (INDEPENDENT_AMBULATORY_CARE_PROVIDER_SITE_OTHER): Payer: Medicare PPO | Admitting: Family Medicine

## 2022-11-28 ENCOUNTER — Encounter: Payer: Self-pay | Admitting: Family Medicine

## 2022-11-28 VITALS — BP 123/84 | HR 77 | Temp 98.5°F | Ht 64.0 in | Wt 142.0 lb

## 2022-11-28 DIAGNOSIS — Z130 Encounter for screening for diseases of the blood and blood-forming organs and certain disorders involving the immune mechanism: Secondary | ICD-10-CM | POA: Diagnosis not present

## 2022-11-28 DIAGNOSIS — M8589 Other specified disorders of bone density and structure, multiple sites: Secondary | ICD-10-CM

## 2022-11-28 DIAGNOSIS — Z0001 Encounter for general adult medical examination with abnormal findings: Secondary | ICD-10-CM

## 2022-11-28 DIAGNOSIS — E7800 Pure hypercholesterolemia, unspecified: Secondary | ICD-10-CM | POA: Diagnosis not present

## 2022-11-28 DIAGNOSIS — E559 Vitamin D deficiency, unspecified: Secondary | ICD-10-CM

## 2022-11-28 DIAGNOSIS — B001 Herpesviral vesicular dermatitis: Secondary | ICD-10-CM | POA: Diagnosis not present

## 2022-11-28 DIAGNOSIS — I10 Essential (primary) hypertension: Secondary | ICD-10-CM | POA: Diagnosis not present

## 2022-11-28 DIAGNOSIS — Z87892 Personal history of anaphylaxis: Secondary | ICD-10-CM | POA: Diagnosis not present

## 2022-11-28 DIAGNOSIS — Z0000 Encounter for general adult medical examination without abnormal findings: Secondary | ICD-10-CM

## 2022-11-28 DIAGNOSIS — Z23 Encounter for immunization: Secondary | ICD-10-CM

## 2022-11-28 DIAGNOSIS — H8112 Benign paroxysmal vertigo, left ear: Secondary | ICD-10-CM

## 2022-11-28 MED ORDER — PITAVASTATIN CALCIUM 4 MG PO TABS: 1.0000 | ORAL_TABLET | Freq: Every day | ORAL | 3 refills | Status: DC

## 2022-11-28 MED ORDER — EPINEPHRINE 0.3 MG/0.3ML IJ SOAJ: 0.3000 mg | INTRAMUSCULAR | 0 refills | Status: DC | PRN

## 2022-11-28 MED ORDER — LOSARTAN POTASSIUM 50 MG PO TABS: 50.0000 mg | ORAL_TABLET | Freq: Every day | ORAL | 3 refills | Status: DC

## 2022-11-28 MED ORDER — VALACYCLOVIR HCL 1 G PO TABS: 1000.0000 mg | ORAL_TABLET | Freq: Every day | ORAL | 3 refills | Status: DC

## 2022-11-28 NOTE — Patient Instructions (Signed)

## 2022-11-28 NOTE — Progress Notes (Signed)
Candice Beck is a 72 y.o. female presents to office today for annual physical exam examination.    Concerns today include: 1.  Vertigo Patient has been suffering from vertigo for several months.  She notes it is quite a bit better than it had been.  She has been doing a modified Epley maneuver on herself at home and this seems to be helping though she reports she continues to have symptoms when she turns her head to the left.  She describes the sensation as the room spinning.  Her mother suffered from severe vertigo as well.  Diet: Typical American  Last eye exam: Up-to-date Last dental exam: Up-to-date Last colonoscopy: Up-to-date Last mammogram: Up-to-date Last pap smear: N/A Refills needed today: All Immunizations needed: Immunization History  Administered Date(s) Administered   Moderna Sars-Covid-2 Vaccination 08/22/2019, 09/19/2019, 05/20/2020   Tdap 09/29/2011   Zoster, Live 09/29/2011     Past Medical History:  Diagnosis Date   Hyperlipidemia    Hypertension    Vitamin D deficiency    Social History   Socioeconomic History   Marital status: Married    Spouse name: Fayrene Fearing   Number of children: 2   Years of education: 14   Highest education level: Not on file  Occupational History   Occupation: Retired    Associate Professor: Tour manager [employer]    Comment: Book Radio broadcast assistant  Tobacco Use   Smoking status: Never   Smokeless tobacco: Never  Vaping Use   Vaping Use: Never used  Substance and Sexual Activity   Alcohol use: Never   Drug use: No   Sexual activity: Yes  Other Topics Concern   Not on file  Social History Narrative   Lives home with her husband - daughter lives next door, son in [place], [place]   Enjoys working in her garden and walking her dog   Social Determinants of Health   Financial Resource Strain: Low Risk  (12/08/2021)   Overall Financial Resource Strain (CARDIA)    Difficulty of Paying Living Expenses: Not hard at all  Food  Insecurity: No Food Insecurity (12/08/2021)   Hunger Vital Sign    Worried About Running Out of Food in the Last Year: Never true    Ran Out of Food in the Last Year: Never true  Transportation Needs: No Transportation Needs (12/08/2021)   PRAPARE - Administrator, Civil Service (Medical): No    Lack of Transportation (Non-Medical): No  Physical Activity: Sufficiently Active (12/08/2021)   Exercise Vital Sign    Days of Exercise per Week: 7 days    Minutes of Exercise per Session: 30 min  Stress: No Stress Concern Present (12/08/2021)   [employer] of Occupational Health - Occupational Stress Questionnaire    Feeling of Stress : Not at all  Social Connections: Socially Integrated (12/08/2021)   Social Connection and Isolation Panel [NHANES]    Frequency of Communication with Friends and Family: More than three times a week    Frequency of Social Gatherings with Friends and Family: Twice a week    Attends Religious Services: More than 4 times per year    Active Member of [employer] or Organizations: Yes    Attends Banker Meetings: More than 4 times per year    Marital Status: Married  Catering manager Violence: Not At Risk (12/08/2021)   Humiliation, Afraid, Rape, and Kick questionnaire    Fear of Current or Ex-Partner: No    Emotionally  Abused: No    Physically Abused: No    Sexually Abused: No   Past Surgical History:  Procedure Laterality Date   TONSILLECTOMY     Family History  Problem Relation Age of Onset   Cancer Mother        breast    Heart disease Mother        aortic valve replaced x2   Osteoporosis Mother    Cancer Father        throat   Heart disease Brother        heart attack age 54   Diabetes Brother        due to weight   Hypertension Brother    Hypertension Brother    Cancer Maternal Aunt        breast    Current Outpatient Medications:    cholecalciferol (VITAMIN D) 1000 UNITS tablet, Take 5,000 Units by mouth daily. , Disp:  , Rfl:    Coenzyme Q10 (CO Q 10 PO), Take 1 tablet by mouth daily., Disp: , Rfl:    EPINEPHrine 0.3 mg/0.3 mL IJ SOAJ injection, Inject 0.3 mg into the muscle as needed (for anaphylactic / allergic reaction. Then call 911)., Disp: 1 each, Rfl: 0   losartan (COZAAR) 50 MG tablet, Take 1 tablet (50 mg total) by mouth daily., Disp: 90 tablet, Rfl: 3   Pitavastatin Calcium (LIVALO) 4 MG TABS, Take 1 tablet (4 mg total) by mouth daily., Disp: 90 tablet, Rfl: 3   valACYclovir (VALTREX) 1000 MG tablet, TAKE 1 TABLET BY MOUTH EVERY DAY, Disp: 90 tablet, Rfl: 0  No Known Allergies   ROS: Review of Systems A comprehensive review of systems was negative except for: Gastrointestinal: positive for dyspepsia Neurological: positive for vertigo    Physical exam BP 123/84   Pulse 77   Temp 98.5 F (36.9 C)   Ht 5\' 4"  (1.626 m)   Wt 142 lb (64.4 kg)   SpO2 100%   BMI 24.37 kg/m  General appearance: alert, cooperative, appears stated age, and no distress Head: Normocephalic, without obvious abnormality, atraumatic Eyes: negative findings: lids and lashes normal, conjunctivae and sclerae normal, corneas clear, and pupils equal, round, reactive to light and accomodation Ears: normal TM's and external ear canals both ears Nose: Nares normal. Septum midline. Mucosa normal. No drainage or sinus tenderness. Throat: lips, mucosa, and tongue normal; teeth and gums normal Neck: no adenopathy, no carotid bruit, supple, symmetrical, trachea midline, and thyroid not enlarged, symmetric, no tenderness/mass/nodules Back: symmetric, no curvature. ROM normal. No CVA tenderness. Lungs: clear to auscultation bilaterally Heart: regular rate and rhythm, S1, S2 normal, no murmur, click, rub or gallop Abdomen: soft, non-tender; bowel sounds normal; no masses,  no organomegaly Extremities: extremities normal, atraumatic, no cyanosis or edema Pulses: 2+ and symmetric Skin: Skin color, texture, turgor normal. No rashes or  lesions Lymph nodes: Cervical, supraclavicular, and axillary nodes normal. Neurologic: Grossly normal, no nystagmus.  No focal neurologic deficits    12/08/2021    9:53 AM 12/07/2021   11:18 AM 02/04/2021   11:02 AM  Depression screen PHQ 2/9  Decreased Interest 0 0 0  Down, Depressed, Hopeless 0 0 0  PHQ - 2 Score 0 0 0    Assessment/ Plan: [name] here for annual physical exam.   Annual physical exam  Primary hypertension - Plan: CMP14+EGFR, losartan (COZAAR) 50 MG tablet  Vitamin D deficiency - Plan: VITAMIN D 25 Hydroxy (Vit-D Deficiency, Fractures)  Osteopenia of multiple  sites - Plan: VITAMIN D 25 Hydroxy (Vit-D Deficiency, Fractures)  Pure hypercholesterolemia - Plan: Lipid Panel, TSH, Pitavastatin Calcium (LIVALO) 4 MG TABS  Screening, anemia, deficiency, iron - Plan: CBC  History of anaphylaxis - Plan: EPINEPHrine 0.3 mg/0.3 mL IJ SOAJ injection  Herpes labialis without complication - Plan: valACYclovir (VALTREX) 1000 MG tablet  BPPV (benign paroxysmal positional vertigo), left - Plan: Ambulatory referral to Physical Therapy  Blood pressure remains controlled.  Check renal function, liver enzymes.  Check vitamin D level given history of osteopenia and deficiency  Fasting labs collected.  Livalo renewed  Screening CBC collected  EpiPen renewed for as needed use.  Continue as needed Valtrex  Suspect BPPV given left-sided symptomology.  Referral to vestibular rehab placed.  Discussed alternative diagnosis including Mnire's disease.  Could consider referral to neurology pending response to vestibular rehabilitation  Counseled on healthy lifestyle choices, including diet (rich in fruits, vegetables and lean meats and low in salt and simple carbohydrates) and exercise (at least 30 minutes of moderate physical activity daily).  Patient to follow up in 1 year for annual exam or sooner if needed.  Yadiel Aubry M. Nadine Counts, DO

## 2022-11-29 LAB — CMP14+EGFR
ALT: 21 IU/L (ref 0–32)
AST: 22 IU/L (ref 0–40)
Albumin/Globulin Ratio: 1.9
Albumin: 4.4 g/dL (ref 3.9–4.9)
Alkaline Phosphatase: 100 IU/L (ref 44–121)
BUN/Creatinine Ratio: 15 (ref 12–28)
BUN: 12 mg/dL (ref 8–27)
Bilirubin Total: 0.6 mg/dL (ref 0.0–1.2)
CO2: 22 mmol/L (ref 20–29)
Calcium: 9.4 mg/dL (ref 8.7–10.3)
Chloride: 105 mmol/L (ref 96–106)
Creatinine, Ser: 0.82 mg/dL (ref 0.57–1.00)
Globulin, Total: 2.3 g/dL (ref 1.5–4.5)
Glucose: 91 mg/dL (ref 70–99)
Potassium: 4.1 mmol/L (ref 3.5–5.2)
Sodium: 141 mmol/L (ref 134–144)
Total Protein: 6.7 g/dL (ref 6.0–8.5)
eGFR: 77 mL/min/{1.73_m2} (ref >59)

## 2022-11-29 LAB — TSH: TSH: 2.14 u[IU]/mL (ref 0.450–4.500)

## 2022-11-29 LAB — CBC
Hematocrit: 42.4 % (ref 34.0–46.6)
Hemoglobin: 14.1 g/dL (ref 11.1–15.9)
MCH: 29.2 pg (ref 26.6–33.0)
MCHC: 33.3 g/dL (ref 31.5–35.7)
MCV: 88 fL (ref 79–97)
Platelets: 327 10*3/uL (ref 150–450)
RBC: 4.83 x10E6/uL (ref 3.77–5.28)
RDW: 13.1 % (ref 11.7–15.4)
WBC: 7.6 10*3/uL (ref 3.4–10.8)

## 2022-11-29 LAB — LIPID PANEL
Chol/HDL Ratio: 3.8 ratio (ref 0.0–4.4)
Cholesterol, Total: 117 mg/dL (ref 100–199)
HDL: 31 mg/dL — ABNORMAL LOW (ref >39)
LDL Chol Calc (NIH): 68 mg/dL (ref 0–99)
Triglycerides: 90 mg/dL (ref 0–149)
VLDL Cholesterol Cal: 18 mg/dL (ref 5–40)

## 2022-11-29 LAB — VITAMIN D 25 HYDROXY (VIT D DEFICIENCY, FRACTURES): Vit D, 25-Hydroxy: 71.1 ng/mL (ref 30.0–100.0)

## 2022-11-30 DIAGNOSIS — K219 Gastro-esophageal reflux disease without esophagitis: Secondary | ICD-10-CM | POA: Diagnosis not present

## 2022-11-30 DIAGNOSIS — H269 Unspecified cataract: Secondary | ICD-10-CM | POA: Diagnosis not present

## 2022-11-30 DIAGNOSIS — N182 Chronic kidney disease, stage 2 (mild): Secondary | ICD-10-CM | POA: Diagnosis not present

## 2022-11-30 DIAGNOSIS — Z8249 Family history of ischemic heart disease and other diseases of the circulatory system: Secondary | ICD-10-CM | POA: Diagnosis not present

## 2022-11-30 DIAGNOSIS — E785 Hyperlipidemia, unspecified: Secondary | ICD-10-CM | POA: Diagnosis not present

## 2022-11-30 DIAGNOSIS — Z803 Family history of malignant neoplasm of breast: Secondary | ICD-10-CM | POA: Diagnosis not present

## 2022-11-30 DIAGNOSIS — M8580 Other specified disorders of bone density and structure, unspecified site: Secondary | ICD-10-CM | POA: Diagnosis not present

## 2022-11-30 DIAGNOSIS — B009 Herpesviral infection, unspecified: Secondary | ICD-10-CM | POA: Diagnosis not present

## 2022-11-30 DIAGNOSIS — M1990 Unspecified osteoarthritis, unspecified site: Secondary | ICD-10-CM | POA: Diagnosis not present

## 2022-12-06 DIAGNOSIS — S62630A Displaced fracture of distal phalanx of right index finger, initial encounter for closed fracture: Secondary | ICD-10-CM | POA: Diagnosis not present

## 2022-12-06 DIAGNOSIS — S62630B Displaced fracture of distal phalanx of right index finger, initial encounter for open fracture: Secondary | ICD-10-CM | POA: Diagnosis not present

## 2022-12-06 DIAGNOSIS — S62660B Nondisplaced fracture of distal phalanx of right index finger, initial encounter for open fracture: Secondary | ICD-10-CM | POA: Diagnosis not present

## 2022-12-06 DIAGNOSIS — S61210A Laceration without foreign body of right index finger without damage to nail, initial encounter: Secondary | ICD-10-CM | POA: Diagnosis not present

## 2022-12-06 DIAGNOSIS — R69 Illness, unspecified: Secondary | ICD-10-CM | POA: Diagnosis not present

## 2022-12-12 DIAGNOSIS — S62630B Displaced fracture of distal phalanx of right index finger, initial encounter for open fracture: Secondary | ICD-10-CM | POA: Diagnosis not present

## 2022-12-12 DIAGNOSIS — S61210A Laceration without foreign body of right index finger without damage to nail, initial encounter: Secondary | ICD-10-CM | POA: Diagnosis not present

## 2022-12-13 ENCOUNTER — Ambulatory Visit: Payer: Medicare PPO | Admitting: Family Medicine

## 2022-12-23 ENCOUNTER — Ambulatory Visit (INDEPENDENT_AMBULATORY_CARE_PROVIDER_SITE_OTHER): Payer: Medicare PPO

## 2022-12-23 VITALS — Ht 65.0 in | Wt 142.0 lb

## 2022-12-23 DIAGNOSIS — Z0000 Encounter for general adult medical examination without abnormal findings: Secondary | ICD-10-CM | POA: Diagnosis not present

## 2022-12-23 NOTE — Patient Instructions (Signed)
Candice Beck , Thank you for taking time to come for your Medicare Wellness Visit. I appreciate your ongoing commitment to your health goals. Please review the following plan we discussed and let me know if I can assist you in the future.   These are the goals we discussed:  Goals      Increase physical activity     Walk at least times per week for 30 minutes each session.         This is a list of the screening recommended for you and due dates:  Health Maintenance  Topic Date Due   Pneumonia Vaccine (1 of 1 - PCV) Never done   COVID-19 Vaccine (4 - 2023-24 season) 02/18/2022   Zoster (Shingles) Vaccine (1 of 2) 02/28/2023*   Flu Shot  01/19/2023   Mammogram  06/07/2023   DEXA scan (bone density measurement)  12/09/2023   Medicare Annual Wellness Visit  12/23/2023   Colon Cancer Screening  12/28/2026   DTaP/Tdap/Td vaccine (3 - Td or Tdap) 11/27/2032   Hepatitis C Screening  Completed   HPV Vaccine  Aged Out  *Topic was postponed. The date shown is not the original due date.    Advanced directives: Please bring a copy of your health care power of attorney and living will to the office to be added to your chart at your convenience.   Conditions/risks identified: Aim for 30 minutes of exercise or brisk walking, 6-8 glasses of water, and 5 servings of fruits and vegetables each day.   Next appointment: Follow up in one year for your annual wellness visit.   Preventive Care 62 Years and Older, Female  Preventive care refers to lifestyle choices and visits with your health care provider that can promote health and wellness. What does preventive care include? A yearly physical exam. This is also called an annual well check. Dental exams once or twice a year. Routine eye exams. Ask your health care provider how often you should have your eyes checked. Personal lifestyle choices, including: Daily care of your teeth and gums. Regular physical activity. Eating a healthy  diet. Avoiding tobacco and drug use. Limiting alcohol use. Practicing safe sex. Taking low doses of aspirin every day. Taking vitamin and mineral supplements as recommended by your health care provider. What happens during an annual well check? The services and screenings done by your health care provider during your annual well check will depend on your age, overall health, lifestyle risk factors, and family history of disease. Counseling  Your health care provider may ask you questions about your: Alcohol use. Tobacco use. Drug use. Emotional well-being. Home and relationship well-being. Sexual activity. Eating habits. History of falls. Memory and ability to understand (cognition). Work and work Astronomer. Screening  You may have the following tests or measurements: Height, weight, and BMI. Blood pressure. Lipid and cholesterol levels. These may be checked every 5 years, or more frequently if you are over 57 years old. Skin check. Lung cancer screening. You may have this screening every year starting at age 52 if you have a 30-pack-year history of smoking and currently smoke or have quit within the past 15 years. Fecal occult blood test (FOBT) of the stool. You may have this test every year starting at age 10. Flexible sigmoidoscopy or colonoscopy. You may have a sigmoidoscopy every 5 years or a colonoscopy every 10 years starting at age 39. Prostate cancer screening. Recommendations will vary depending on your family history and other risks. Hepatitis  C blood test. Hepatitis B blood test. Sexually transmitted disease (STD) testing. Diabetes screening. This is done by checking your blood sugar (glucose) after you have not eaten for a while (fasting). You may have this done every 1-3 years. Abdominal aortic aneurysm (AAA) screening. You may need this if you are a current or former smoker. Osteoporosis. You may be screened starting at age 28 if you are at high risk. Talk with  your health care provider about your test results, treatment options, and if necessary, the need for more tests. Vaccines  Your health care provider may recommend certain vaccines, such as: Influenza vaccine. This is recommended every year. Tetanus, diphtheria, and acellular pertussis (Tdap, Td) vaccine. You may need a Td booster every 10 years. Zoster vaccine. You may need this after age 38. Pneumococcal 13-valent conjugate (PCV13) vaccine. One dose is recommended after age 65. Pneumococcal polysaccharide (PPSV23) vaccine. One dose is recommended after age 5. Talk to your health care provider about which screenings and vaccines you need and how often you need them. This information is not intended to replace advice given to you by your health care provider. Make sure you discuss any questions you have with your health care provider. Document Released: 07/03/2015 Document Revised: 02/24/2016 Document Reviewed: 04/07/2015 Elsevier Interactive Patient Education  2017 [employer].  Fall Prevention in the Home Falls can cause injuries. They can happen to people of all ages. There are many things you can do to make your home safe and to help prevent falls. What can I do on the outside of my home? Regularly fix the edges of walkways and driveways and fix any cracks. Remove anything that might make you trip as you walk through a door, such as a raised step or threshold. Trim any bushes or trees on the path to your home. Use bright outdoor lighting. Clear any walking paths of anything that might make someone trip, such as rocks or tools. Regularly check to see if handrails are loose or broken. Make sure that both sides of any steps have handrails. Any raised decks and porches should have guardrails on the edges. Have any leaves, snow, or ice cleared regularly. Use sand or salt on walking paths during winter. Clean up any spills in your garage right away. This includes oil or grease spills. What  can I do in the bathroom? Use night lights. Install grab bars by the toilet and in the tub and shower. Do not use towel bars as grab bars. Use non-skid mats or decals in the tub or shower. If you need to sit down in the shower, use a plastic, non-slip stool. Keep the floor dry. Clean up any water that spills on the floor as soon as it happens. Remove soap buildup in the tub or shower regularly. Attach bath mats securely with double-sided non-slip rug tape. Do not have throw rugs and other things on the floor that can make you trip. What can I do in the bedroom? Use night lights. Make sure that you have a light by your bed that is easy to reach. Do not use any sheets or blankets that are too big for your bed. They should not hang down onto the floor. Have a firm chair that has side arms. You can use this for support while you get dressed. Do not have throw rugs and other things on the floor that can make you trip. What can I do in the kitchen? Clean up any spills right away. Avoid walking  on wet floors. Keep items that you use a lot in easy-to-reach places. If you need to reach something above you, use a strong step stool that has a grab bar. Keep electrical cords out of the way. Do not use floor polish or wax that makes floors slippery. If you must use wax, use non-skid floor wax. Do not have throw rugs and other things on the floor that can make you trip. What can I do with my stairs? Do not leave any items on the stairs. Make sure that there are handrails on both sides of the stairs and use them. Fix handrails that are broken or loose. Make sure that handrails are as long as the stairways. Check any carpeting to make sure that it is firmly attached to the stairs. Fix any carpet that is loose or worn. Avoid having throw rugs at the top or bottom of the stairs. If you do have throw rugs, attach them to the floor with carpet tape. Make sure that you have a light switch at the top of the  stairs and the bottom of the stairs. If you do not have them, ask someone to add them for you. What else can I do to help prevent falls? Wear shoes that: Do not have high heels. Have rubber bottoms. Are comfortable and fit you well. Are closed at the toe. Do not wear sandals. If you use a stepladder: Make sure that it is fully opened. Do not climb a closed stepladder. Make sure that both sides of the stepladder are locked into place. Ask someone to hold it for you, if possible. Clearly mark and make sure that you can see: Any grab bars or handrails. First and last steps. Where the edge of each step is. Use tools that help you move around (mobility aids) if they are needed. These include: Canes. Walkers. Scooters. Crutches. Turn on the lights when you go into a dark area. Replace any light bulbs as soon as they burn out. Set up your furniture so you have a clear path. Avoid moving your furniture around. If any of your floors are uneven, fix them. If there are any pets around you, be aware of where they are. Review your medicines with your doctor. Some medicines can make you feel dizzy. This can increase your chance of falling. Ask your doctor what other things that you can do to help prevent falls. This information is not intended to replace advice given to you by your health care provider. Make sure you discuss any questions you have with your health care provider. Document Released: 04/02/2009 Document Revised: 11/12/2015 Document Reviewed: 07/11/2014 Elsevier Interactive Patient Education  2017 [employer].

## 2022-12-23 NOTE — Progress Notes (Signed)
Subjective:   Candice Beck is a 72 y.o. female who presents for Medicare Annual (Subsequent) preventive examination.  Visit Complete: Virtual  I connected with  Candice Beck on 12/23/22 by a audio enabled telemedicine application and verified that I am speaking with the correct person using two identifiers.  Patient Location: Home  Provider Location: Home Office  I discussed the limitations of evaluation and management by telemedicine. The patient expressed understanding and agreed to proceed.  Patient Medicare AWV questionnaire was completed by the patient on 12/23/2022; I have confirmed that all information answered by patient is correct and no changes since this date.  Review of Systems     Cardiac Risk Factors include: advanced age (>4men, >34 women);dyslipidemia;hypertension     Objective:    Today's Vitals   12/23/22 0917  Weight: 142 lb (64.4 kg)  Height: 5\' 5"  (1.651 m)   Body mass index is 23.63 kg/m.     12/23/2022    9:26 AM 06/20/2022    9:31 AM 12/08/2021    9:55 AM 12/07/2020    9:55 AM 11/22/2018    3:19 PM  Advanced Directives  Does Patient Have a Medical Advance Directive? Yes Yes Yes No No  Type of Estate agent of [place];Living will Healthcare Power of [place];Living will Healthcare Power of [place];Living will    Copy of Healthcare Power of Attorney in Chart? No - copy requested  No - copy requested    Would patient like information on creating a medical advance directive?    Yes (MAU/Ambulatory/Procedural Areas - Information given) Yes (MAU/Ambulatory/Procedural Areas - Information given)    Current Medications (verified) Outpatient Encounter Medications as of 12/23/2022  Medication Sig   cholecalciferol (VITAMIN D) 1000 UNITS tablet Take 5,000 Units by mouth daily.    Coenzyme Q10 (CO Q 10 PO) Take 1 tablet by mouth daily.   EPINEPHrine 0.3 mg/0.3 mL IJ SOAJ injection Inject 0.3 mg into the muscle as needed (for  anaphylactic / allergic reaction. Then call 911).   losartan (COZAAR) 50 MG tablet Take 1 tablet (50 mg total) by mouth daily.   Pitavastatin Calcium (LIVALO) 4 MG TABS Take 1 tablet (4 mg total) by mouth daily.   valACYclovir (VALTREX) 1000 MG tablet Take 1 tablet (1,000 mg total) by mouth daily.   No facility-administered encounter medications on file as of 12/23/2022.    Allergies (verified) Bee venom   History: Past Medical History:  Diagnosis Date   Hyperlipidemia    Hypertension    Vitamin D deficiency    Past Surgical History:  Procedure Laterality Date   TONSILLECTOMY     Family History  Problem Relation Age of Onset   Cancer Mother        breast    Heart disease Mother        aortic valve replaced x2   Osteoporosis Mother    Cancer Father        throat   Heart disease Brother        heart attack age 70   Diabetes Brother        due to weight   Hypertension Brother    Hypertension Brother    Cancer Maternal Aunt        breast   Social History   Socioeconomic History   Marital status: Married    Spouse name: Fayrene Fearing   Number of children: 2   Years of education: 14   Highest education level: Not on  file  Occupational History   Occupation: Retired    Associate Professor: [employer]    Comment: Book Radio broadcast assistant  Tobacco Use   Smoking status: Never   Smokeless tobacco: Never  Vaping Use   Vaping Use: Never used  Substance and Sexual Activity   Alcohol use: Never   Drug use: No   Sexual activity: Yes  Other Topics Concern   Not on file  Social History Narrative   Lives home with her husband - daughter lives next door, son in [place], [place]   Enjoys working in her garden and walking her dog   Social Determinants of Health   Financial Resource Strain: Low Risk  (12/23/2022)   Overall Financial Resource Strain (CARDIA)    Difficulty of Paying Living Expenses: Not hard at all  Food Insecurity: No Food Insecurity (12/23/2022)   Hunger  Vital Sign    Worried About Running Out of Food in the Last Year: Never true    Ran Out of Food in the Last Year: Never true  Transportation Needs: No Transportation Needs (12/23/2022)   PRAPARE - Administrator, Civil Service (Medical): No    Lack of Transportation (Non-Medical): No  Physical Activity: Insufficiently Active (12/23/2022)   Exercise Vital Sign    Days of Exercise per Week: 3 days    Minutes of Exercise per Session: 30 min  Stress: No Stress Concern Present (12/23/2022)   [employer] of Occupational Health - Occupational Stress Questionnaire    Feeling of Stress : Not at all  Social Connections: Socially Integrated (12/23/2022)   Social Connection and Isolation Panel [NHANES]    Frequency of Communication with Friends and Family: More than three times a week    Frequency of Social Gatherings with Friends and Family: More than three times a week    Attends Religious Services: More than 4 times per year    Active Member of [employer] or Organizations: Yes    Attends Engineer, structural: More than 4 times per year    Marital Status: Married    Tobacco Counseling Counseling given: Not Answered   Clinical Intake:  Pre-visit preparation completed: Yes  Pain : No/denies pain     Nutritional Risks: None Diabetes: No  How often do you need to have someone help you when you read instructions, pamphlets, or other written materials from your doctor or pharmacy?: 1 - Never  Interpreter Needed?: No  Information entered by :: Renie Ora, LPN   Activities of Daily Living    12/23/2022    9:26 AM  In your present state of health, do you have any difficulty performing the following activities:  Hearing? 0  Vision? 0  Difficulty concentrating or making decisions? 0  Walking or climbing stairs? 0  Dressing or bathing? 0  Doing errands, shopping? 0  Preparing Food and eating ? N  Using the Toilet? N  In the past six months, have you accidently leaked  urine? N  Do you have problems with loss of bowel control? N  Managing your Medications? N  Managing your Finances? N  Housekeeping or managing your Housekeeping? N    Patient Care Team: Raliegh Ip, DO as PCP - General (Family Medicine)  Indicate any recent Medical Services you may have received from other than [facility] providers in the past year (date may be approximate).     Assessment:   This is a routine wellness examination for Shykeria.  Hearing/Vision screen  Vision Screening - Comments:: Wears rx glasses - up to date with routine eye exams with  Dr.Groat   Dietary issues and exercise activities discussed:     Goals Addressed   None    Depression Screen    12/23/2022    9:21 AM 12/08/2021    9:53 AM 12/07/2021   11:18 AM 02/04/2021   11:02 AM 12/07/2020    9:54 AM 07/29/2020    2:58 PM 03/18/2020    8:12 AM  PHQ 2/9 Scores  PHQ - 2 Score 0 0 0 0 0 0 0  PHQ- 9 Score      0     Fall Risk    12/23/2022    9:18 AM 11/28/2022    8:00 AM 12/08/2021    9:51 AM 12/07/2021   11:18 AM 02/04/2021   11:02 AM  Fall Risk   Falls in the past year? 0 0 0 0 0  Number falls in past yr: 0 0 0    Injury with Fall? 0 0 0    Risk for fall due to : No Fall Risks No Fall Risks Orthopedic patient    Follow up Falls prevention discussed Education provided Falls prevention discussed      MEDICARE RISK AT HOME:  Medicare Risk at Home - 12/23/22 4098     Any stairs in or around the home? Yes    If so, are there any without handrails? No    Home free of loose throw rugs in walkways, pet beds, electrical cords, etc? Yes    Adequate lighting in your home to reduce risk of falls? Yes    Life alert? No    Use of a cane, Candice or w/c? No    Grab bars in the bathroom? Yes    Shower chair or bench in shower? Yes    Elevated toilet seat or a handicapped toilet? Yes             TIMED UP AND GO:  Was the test performed?  No    Cognitive Function:    01/31/2017    8:59 AM  MMSE -  Mini Mental State Exam  Orientation to time 5  Orientation to Place 5  Registration 3  Attention/ Calculation 5  Recall 3  Language- name 2 objects 2  Language- repeat 1  Language- follow 3 step command 3  Language- read & follow direction 1  Write a sentence 1  Copy design 1  Total score 30        12/23/2022    9:26 AM 11/22/2018    3:25 PM  6CIT Screen  What Year? 0 points 0 points  What month? 0 points 0 points  What time? 0 points 0 points  Count back from 20 0 points 0 points  Months in reverse 0 points 0 points  Repeat phrase 0 points 0 points  Total Score 0 points 0 points    Immunizations Immunization History  Administered Date(s) Administered   Moderna Sars-Covid-2 Vaccination 08/22/2019, 09/19/2019, 05/20/2020   Tdap 09/29/2011, 11/28/2022   Zoster, Live 09/29/2011    TDAP status: Up to date  Flu Vaccine status: Declined, Education has been provided regarding the importance of this vaccine but patient still declined. Advised may receive this vaccine at local pharmacy or Health Dept. Aware to provide a copy of the vaccination record if obtained from local pharmacy or Health Dept. Verbalized acceptance and understanding.  Pneumococcal vaccine status: Due, Education has been provided regarding  the importance of this vaccine. Advised may receive this vaccine at local pharmacy or Health Dept. Aware to provide a copy of the vaccination record if obtained from local pharmacy or Health Dept. Verbalized acceptance and understanding.  Covid-19 vaccine status: Completed vaccines  Qualifies for Shingles Vaccine? Yes   Zostavax completed No   Shingrix Completed?: No.    Education has been provided regarding the importance of this vaccine. Patient has been advised to call insurance company to determine out of pocket expense if they have not yet received this vaccine. Advised may also receive vaccine at local pharmacy or Health Dept. Verbalized acceptance and  understanding.  Screening Tests Health Maintenance  Topic Date Due   Pneumonia Vaccine 7+ Years old (1 of 1 - PCV) Never done   COVID-19 Vaccine (4 - 2023-24 season) 02/18/2022   Zoster Vaccines- Shingrix (1 of 2) 02/28/2023 (Originally 11/16/2002)   INFLUENZA VACCINE  01/19/2023   MAMMOGRAM  06/07/2023   DEXA SCAN  12/09/2023   Medicare Annual Wellness (AWV)  12/23/2023   Colonoscopy  12/28/2026   DTaP/Tdap/Td (3 - Td or Tdap) 11/27/2032   Hepatitis C Screening  Completed   HPV VACCINES  Aged Out    Health Maintenance  Health Maintenance Due  Topic Date Due   Pneumonia Vaccine 50+ Years old (1 of 1 - PCV) Never done   COVID-19 Vaccine (4 - 2023-24 season) 02/18/2022    Colorectal cancer screening: Type of screening: Colonoscopy. Completed 12/27/2016. Repeat every 10 years  Mammogram status: Completed 06/06/2022. Repeat every year  Bone Density status: Completed 12/08/2021. Results reflect: Bone density results: OSTEOPOROSIS. Repeat every 2 years.  Lung Cancer Screening: (Low Dose CT Chest recommended if Age 46-80 years, 20 pack-year currently smoking OR have quit w/in 15years.) does not qualify.   Lung Cancer Screening Referral: n/a  Additional Screening:  Hepatitis C Screening: does not qualify; Completed 06/01/2017  Vision Screening: Recommended annual ophthalmology exams for early detection of glaucoma and other disorders of the eye. Is the patient up to date with their annual eye exam?  Yes  Who is the provider or what is the name of the office in which the patient attends annual eye exams? Dr.Groat  If pt is not established with a provider, would they like to be referred to a provider to establish care? No .   Dental Screening: Recommended annual dental exams for proper oral hygiene    Community Resource Referral / Chronic Care Management: CRR required this visit?  No   CCM required this visit?  No     Plan:     I have personally reviewed and noted the  following in the patient's chart:   Medical and social history Use of alcohol, tobacco or illicit drugs  Current medications and supplements including opioid prescriptions. Patient is not currently taking opioid prescriptions. Functional ability and status Nutritional status Physical activity Advanced directives List of other physicians Hospitalizations, surgeries, and ER visits in previous 12 months Vitals Screenings to include cognitive, depression, and falls Referrals and appointments  In addition, I have reviewed and discussed with patient certain preventive protocols, quality metrics, and best practice recommendations. A written personalized care plan for preventive services as well as general preventive health recommendations were provided to patient.     Lorrene Reid, LPN   0/08/4740   After Visit Summary: (MyChart) Due to this being a telephonic visit, the after visit summary with patients personalized plan was offered to patient via MyChart  Nurse Notes: none

## 2023-01-02 NOTE — Therapy (Signed)
OUTPATIENT PHYSICAL THERAPY VESTIBULAR EVALUATION     Patient Name: Candice Beck MRN: [id number] DOB:1951/04/01, 72 y.o., female Today's Date: 01/03/2023  END OF SESSION:  PT End of Session - 01/03/23 0816     Visit Number 1    Number of Visits 4    Date for PT Re-Evaluation 01/17/23    Authorization Type [employer] Medicare Choice; $20 copay; no auth, no visit limit    PT Start Time 0815    PT Stop Time 0900    PT Time Calculation (min) 45 min    Activity Tolerance Patient tolerated treatment well    Behavior During Therapy [facility] for tasks assessed/performed             Past Medical History:  Diagnosis Date   Hyperlipidemia    Hypertension    Vitamin D deficiency    Past Surgical History:  Procedure Laterality Date   TONSILLECTOMY     Patient Active Problem List   Diagnosis Date Noted   COVID-19 virus RNA test result positive at limit of detection 03/19/2021   Pharyngitis 03/19/2021   Gastroesophageal reflux disease with esophagitis without hemorrhage 03/18/2020   Morton's neuroma of right foot 03/18/2020   Metatarsalgia of both feet 03/18/2020   History of anaphylaxis 03/19/2019   Plantar fasciitis of left foot 10/30/2018   Osteopenia 03/07/2014   Hypertension 04/01/2013   Hyperlipidemia 04/01/2013   Vitamin D deficiency 04/01/2013    PCP: Raliegh Ip, DO REFERRING PROVIDER: Raliegh Ip, DO  REFERRING DIAG: [phone] (ICD-10-CM) - BPPV (benign paroxysmal positional vertigo), left  THERAPY DIAG:  Dizziness and giddiness - Plan: PT plan of care cert/re-cert  ONSET DATE: 08/23/22  Rationale for Evaluation and Treatment: Rehabilitation  SUBJECTIVE:   SUBJECTIVE STATEMENT: Woke up 08/23/22 with dizziness; "threw me back into the bed"; nauseated; now sleeps on right side; "I know not to look to the left side"; it's like I'm on a ride at a fair.  Try to look straight forward.  Had gotten better but on Sunday it seemed to flare up.  Also has a  floater in left eye.  Went to PCP in June who referred to therapy Pt accompanied by: self  PERTINENT HISTORY: recently was on doxycycline for cutting finger and caused her feet to break out.    PAIN:  Are you having pain? Yes: NPRS scale: 1/10 Pain location: right pointer finger Pain description: numb, tight Aggravating factors: looking to the left and  up or down Relieving factors: keeping eyes straight   PRECAUTIONS: None     WEIGHT BEARING RESTRICTIONS: No  FALLS: Has patient fallen in last 6 months? No  LIVING ENVIRONMENT: Lives with: lives with their spouse Lives in: House/apartment PLOF: Independent  PATIENT GOALS: dizziness to go away  OBJECTIVE:   DIAGNOSTIC FINDINGS: 0  COGNITION: Overall cognitive status: Within functional limits for tasks assessed   SENSATION: WFL  POSTURE:  No Significant postural limitations  Cervical ROM:  slow guarded movement but mobility wfl  Active AROM (deg) eval  Flexion 1 dizziness  Extension 1 dizziness  Right lateral flexion 1  Left lateral flexion 1  Right rotation 0  Left rotation 0  (Blank rows = not tested)  STRENGTH: grossly appears wfl  LOWER EXTREMITY MMT:   MMT Right eval Left eval  Hip flexion    Hip abduction    Hip adduction    Hip internal rotation    Hip external rotation    Knee flexion  Knee extension    Ankle dorsiflexion    Ankle plantarflexion    Ankle inversion    Ankle eversion    (Blank rows = not tested)  BED MOBILITY:  Sit to supine CGA Supine to sit CGA Rolling to Right CGA  TRANSFERS: Assistive device utilized: None  Sit to stand: Complete Independence Stand to sit: Complete Independence Chair to chair: Complete Independence Floor:  not tested  GAIT: Gait pattern: WFL Distance walked: 50 ft in clinic Assistive device utilized: None Level of assistance: Complete Independence Comments:   PATIENT SURVEYS:  FOTO 39  VESTIBULAR ASSESSMENT:  GENERAL OBSERVATION:  pleasant; no glasses; normal gait pattern; guarded head movements   SYMPTOM BEHAVIOR:  Subjective history: see above  Non-Vestibular symptoms: headaches and nausea/vomiting  Type of dizziness: Spinning/Vertigo  Frequency: varies  Duration: varies  Aggravating factors: Induced by position change: rolling to the left and Induced by motion: looking up at the ceiling and turning head quickly  Relieving factors: head stationary, dark room, and closing eyes  Progression of symptoms: better  OCULOMOTOR EXAM:  Ocular Alignment: normal  Ocular ROM: No Limitations  Spontaneous Nystagmus: absent  Gaze-Induced Nystagmus: absent  Smooth Pursuits: intact  Saccades: intact  Convergence/Divergence: 1 inch     VESTIBULAR - OCULAR REFLEX:   Slow VOR: Normal  VOR Cancellation: Normal  Head-Impulse Test: HIT Left: positive mild dizziness noted  Dynamic Visual Acuity:  not tested   POSITIONAL TESTING: Right Dix-Hallpike: no nystagmus Left Dix-Hallpike: upbeating, left nystagmus  MOTION SENSITIVITY:  Motion Sensitivity Quotient Intensity: 0 = none, 1 = Lightheaded, 2 = Mild, 3 = Moderate, 4 = Severe, 5 = Vomiting  Intensity  1. Sitting to supine   2. Supine to L side   3. Supine to R side   4. Supine to sitting   5. L Hallpike-Dix 4  6. Up from L  3  7. R Hallpike-Dix 0  8. Up from R  0  9. Sitting, head tipped to L knee   10. Head up from L knee   11. Sitting, head tipped to R knee   12. Head up from R knee   13. Sitting head turns x5   14.Sitting head nods x5   15. In stance, 180 turn to L    16. In stance, 180 turn to R     OTHOSTATICS: not done  FUNCTIONAL GAIT:  not tested   VESTIBULAR TREATMENT:                                                                                                   DATE: 01/03/23 physical therapy evaluation  Canalith Repositioning:  Epley Left: Number of Reps: 1, Response to Treatment: symptoms improved, and Comment: dizzy on initial sitting  back up but resolves in 10 to 15 sec Gaze Adaptation:   Habituation:   Other:   PATIENT EDUCATION: Education details: Patient educated on exam findings, POC, scope of PT, HEP, and what to expect next visit. Person educated: Patient Education method: Explanation, Demonstration, and Handouts Education comprehension: verbalized understanding, returned demonstration, verbal cues required,  and tactile cues required   HOME EXERCISE PROGRAM: Access Code: [email or web address] URL: [email or web address] Date: 01/03/2023 Prepared by: AP - Rehab  Patient Education - BPPV - What Is BPPV? - BPPV - BPPV - After BPPV Repositioning   GOALS: Goals reviewed with patient? No  SHORT TERM GOALS: Target date: 01/10/2023  Patient will self report 50% improvement to improve tolerance for functional activity  Baseline: Goal status: INITIAL  LONG TERM GOALS: Target date: 01/17/2023  Patient will self report 75% improvement to improve tolerance for functional activity  Baseline:  Goal status: INITIAL  2.  Patient will improve FOTO score by 20 points to demonstrate improved perceived functional mobility Baseline: 39 Goal status: INITIAL  3.  Patient will go through a 24 hour period without any episode of dizziness Baseline:  Goal status: INITIAL  4.  Patient will have a negative DHP for left posterior canal BPPV to improve tolerance for rolling to her left side when sleeping Baseline:  Goal status: INITIAL  ASSESSMENT:  CLINICAL IMPRESSION: Patient is a 72 y.o. female who was seen today for physical therapy evaluation and treatment for H81.12 (ICD-10-CM) - BPPV (benign paroxysmal positional vertigo), left. Patient demonstrates increased vestibular symptoms with provocative testing which is negatively impacting patient ability to perform ADLs and functional mobility tasks. Patient will benefit from skilled physical therapy services to address these deficits to improve level of  function with ADLs, functional mobility tasks, and reduce risk for falls.    OBJECTIVE IMPAIRMENTS: decreased activity tolerance, decreased knowledge of condition, dizziness, and increased muscle spasms.   ACTIVITY LIMITATIONS: bed mobility, reach over head, and caring for others  PARTICIPATION LIMITATIONS: meal prep, cleaning, laundry, driving, shopping, and community activity  REHAB POTENTIAL: Good  CLINICAL DECISION MAKING: Evolving/moderate complexity  EVALUATION COMPLEXITY: Moderate   PLAN:  PT FREQUENCY: 2x/week  PT DURATION: 2 weeks  PLANNED INTERVENTIONS: Therapeutic exercises, Therapeutic activity, Neuromuscular re-education, Balance training, Gait training, Patient/Family education, Joint manipulation, Joint mobilization, Stair training, Orthotic/Fit training, DME instructions, Aquatic Therapy, Dry Needling, Electrical stimulation, Spinal manipulation, Spinal mobilization, Cryotherapy, Moist heat, Compression bandaging, scar mobilization, Splintting, Taping, Traction, Ultrasound, Ionotophoresis 4mg /ml Dexamethasone, and Manual therapy   PLAN FOR NEXT SESSION: Review goals; retest left side posterior canal   9:27 AM, 01/03/23 Ericha Whittingham Small Niya Behler MPT [facility]  [phone] Ph:[phone]

## 2023-01-03 ENCOUNTER — Other Ambulatory Visit: Payer: Self-pay

## 2023-01-03 ENCOUNTER — Ambulatory Visit (HOSPITAL_COMMUNITY): Payer: Medicare PPO | Attending: Family Medicine

## 2023-01-03 DIAGNOSIS — H8112 Benign paroxysmal vertigo, left ear: Secondary | ICD-10-CM | POA: Insufficient documentation

## 2023-01-03 DIAGNOSIS — R42 Dizziness and giddiness: Secondary | ICD-10-CM | POA: Insufficient documentation

## 2023-01-06 ENCOUNTER — Ambulatory Visit (HOSPITAL_COMMUNITY): Payer: Medicare PPO | Admitting: Physical Therapy

## 2023-01-06 DIAGNOSIS — R42 Dizziness and giddiness: Secondary | ICD-10-CM

## 2023-01-06 DIAGNOSIS — H8112 Benign paroxysmal vertigo, left ear: Secondary | ICD-10-CM | POA: Diagnosis not present

## 2023-01-06 NOTE — Therapy (Signed)
OUTPATIENT PHYSICAL THERAPY VESTIBULAR Treatment     Patient Name: Candice Beck MRN: [id number] DOB:03/22/1951, 72 y.o., female Today's Date: 01/06/2023  END OF SESSION:  PT End of Session - 01/06/23 1423     Visit Number 2    Number of Visits 4    Date for PT Re-Evaluation 01/17/23    Authorization Type [employer] Medicare Choice; $20 copay; no auth, no visit limit    PT Start Time 1345    PT Stop Time 1423    PT Time Calculation (min) 40 min    Activity Tolerance Patient tolerated treatment well    Behavior During Therapy [facility] for tasks assessed/performed             Past Medical History:  Diagnosis Date   Hyperlipidemia    Hypertension    Vitamin D deficiency    Past Surgical History:  Procedure Laterality Date   TONSILLECTOMY     Patient Active Problem List   Diagnosis Date Noted   COVID-19 virus RNA test result positive at limit of detection 03/19/2021   Pharyngitis 03/19/2021   Gastroesophageal reflux disease with esophagitis without hemorrhage 03/18/2020   Morton's neuroma of right foot 03/18/2020   Metatarsalgia of both feet 03/18/2020   History of anaphylaxis 03/19/2019   Plantar fasciitis of left foot 10/30/2018   Osteopenia 03/07/2014   Hypertension 04/01/2013   Hyperlipidemia 04/01/2013   Vitamin D deficiency 04/01/2013    PCP: Raliegh Ip, DO REFERRING PROVIDER: Raliegh Ip, DO  REFERRING DIAG: [phone] (ICD-10-CM) - BPPV (benign paroxysmal positional vertigo), left  THERAPY DIAG:  Dizziness and giddiness  ONSET DATE: 08/23/22  Rationale for Evaluation and Treatment: Rehabilitation  SUBJECTIVE:   SUBJECTIVE STATEMENT:  Pt states she feels a lot better since the maneuver.  She states the next day her head felt  really clear.    Woke up 08/23/22 with dizziness; "threw me back into the bed"; nauseated; now sleeps on right side; "I know not to look to the left side"; it's like I'm on a ride at a fair.  Try to look straight  forward.  Had gotten better but on Sunday it seemed to flare up.  Also has a floater in left eye.  Went to PCP in June who referred to therapy Pt accompanied by: self  PERTINENT HISTORY: recently was on doxycycline for cutting finger and caused her feet to break out.    PAIN:  Are you having pain? Yes: NPRS scale: 1/10 Pain location: right pointer finger Pain description: numb, tight Aggravating factors: looking to the left and  up or down Relieving factors: keeping eyes straight   PRECAUTIONS: None     WEIGHT BEARING RESTRICTIONS: No  FALLS: Has patient fallen in last 6 months? No  LIVING ENVIRONMENT: Lives with: lives with their spouse Lives in: House/apartment PLOF: Independent  PATIENT GOALS: dizziness to go away  OBJECTIVE:   DIAGNOSTIC FINDINGS: 0  COGNITION: Overall cognitive status: Within functional limits for tasks assessed   SENSATION: WFL  POSTURE:  No Significant postural limitations  Cervical ROM:  slow guarded movement but mobility wfl  Active AROM (deg) eval  Flexion 1 dizziness  Extension 1 dizziness  Right lateral flexion 1  Left lateral flexion 1  Right rotation 0  Left rotation 0  (Blank rows = not tested)  STRENGTH: grossly appears wfl  LOWER EXTREMITY MMT:   MMT Right eval Left eval  Hip flexion    Hip abduction    Hip adduction  Hip internal rotation    Hip external rotation    Knee flexion    Knee extension    Ankle dorsiflexion    Ankle plantarflexion    Ankle inversion    Ankle eversion    (Blank rows = not tested)  BED MOBILITY:  Sit to supine CGA Supine to sit CGA Rolling to Right CGA  TRANSFERS: Assistive device utilized: None  Sit to stand: Complete Independence Stand to sit: Complete Independence Chair to chair: Complete Independence Floor:  not tested  GAIT: Gait pattern: WFL Distance walked: 50 ft in clinic Assistive device utilized: None Level of assistance: Complete Independence Comments:    PATIENT SURVEYS:  FOTO 39  VESTIBULAR ASSESSMENT:  GENERAL OBSERVATION: pleasant; no glasses; normal gait pattern; guarded head movements   SYMPTOM BEHAVIOR:  Subjective history: see above  Non-Vestibular symptoms: headaches and nausea/vomiting  Type of dizziness: Spinning/Vertigo  Frequency: varies  Duration: varies  Aggravating factors: Induced by position change: rolling to the left and Induced by motion: looking up at the ceiling and turning head quickly  Relieving factors: head stationary, dark room, and closing eyes  Progression of symptoms: better  OCULOMOTOR EXAM:  Ocular Alignment: normal  Ocular ROM: No Limitations  Spontaneous Nystagmus: absent  Gaze-Induced Nystagmus: absent  Smooth Pursuits: intact  Saccades: intact  Convergence/Divergence: 1 inch     VESTIBULAR - OCULAR REFLEX:   Slow VOR: Normal  VOR Cancellation: Normal  Head-Impulse Test: HIT Left: positive mild dizziness noted  Dynamic Visual Acuity:  not tested   POSITIONAL TESTING: Right Dix-Hallpike: no nystagmus Left Dix-Hallpike: upbeating, left nystagmus  MOTION SENSITIVITY:  Motion Sensitivity Quotient Intensity: 0 = none, 1 = Lightheaded, 2 = Mild, 3 = Moderate, 4 = Severe, 5 = Vomiting  Intensity  1. Sitting to supine   2. Supine to L side   3. Supine to R side   4. Supine to sitting   5. L Hallpike-Dix 4  6. Up from L  3  7. R Hallpike-Dix 0  8. Up from R  0  9. Sitting, head tipped to L knee   10. Head up from L knee   11. Sitting, head tipped to R knee   12. Head up from R knee   13. Sitting head turns x5   14.Sitting head nods x5   15. In stance, 180 turn to L    16. In stance, 180 turn to R     OTHOSTATICS: not done  FUNCTIONAL GAIT:  not tested   VESTIBULAR TREATMENT:                                                                                                 DATE: 01/06/23: Lt hal-pike  (-) Cervical rotation RT 65; lt 60 Cervical rotation x 5 Cervical side  bend x 5 Manual to decrease mm spasm and improve cervical ROM Reviewed correct way to complete Eply's in case vertigo returns    01/03/23 physical therapy evaluation  Canalith Repositioning:  Epley Left: Number of Reps: 1, Response to Treatment: symptoms improved, and Comment: dizzy on initial sitting  back up but resolves in 10 to 15 sec Gaze Adaptation:   Habituation:   Other:   PATIENT EDUCATION: Education details: Patient educated on exam findings, POC, scope of PT, HEP, and what to expect next visit. Person educated: Patient Education method: Explanation, Demonstration, and Handouts Education comprehension: verbalized understanding, returned demonstration, verbal cues required, and tactile cues required   HOME EXERCISE PROGRAM: Access Code: [email or web address] URL: [email or web address] Date: 01/03/2023 Prepared by: AP - Rehab  Patient Education - BPPV - What Is BPPV? - BPPV - BPPV - After BPPV Repositioning 01/06/23 Access Code: [email or web address] URL: [email or web address] Date: 01/06/2023 Prepared by: Virgina Organ  Exercises - Self-Epley Maneuver Left Ear  - 1 x daily - 7 x weekly - 1 sets - 1 reps - Seated Cervical Retraction and Rotation  - 2 x daily - 7 x weekly - 1 sets - 5-10 reps - 5" hold - Seated Cervical Sidebending AROM  - 2 x daily - 7 x weekly - 1 sets - 5-10 reps - 5" hold  GOALS: Goals reviewed with patient? No  SHORT TERM GOALS: Target date: 01/10/2023  Patient will self report 50% improvement to improve tolerance for functional activity  Baseline: Goal status: MET  LONG TERM GOALS: Target date: 01/17/2023  Patient will self report 75% improvement to improve tolerance for functional activity  Baseline:  Goal status: IN PROGRESS  2.  Patient will improve FOTO score by 20 points to demonstrate improved perceived functional mobility Baseline: 39 Goal status: IN PROGRESS  3.  Patient will go through a 24 hour period without  any episode of dizziness Baseline:  Goal status: MET  4.  Patient will have a negative DHP for left posterior canal BPPV to improve tolerance for rolling to her left side when sleeping Baseline:  Goal status: MET  ASSESSMENT:  CLINICAL IMPRESSION: Evaluation and goals reviewed with pt.  Therapist performed Lt Dix-hallpike test.  Therapist explained that a person who has had BPPV is at a greater risk of having it again compared to the norm population. Therapist and pt reviewed Eply's maneuver and how to complete correctly.  Therapist noted pt holding her head very stiff.  Therapist explained if pt continued this she would begin to have cervical problems and stressed the importance of completing cervical ROM exercises.  Therapist cancelled next session due to (-) sx will discharge on Friday if pt continues to be (-) in sx.     Patient is a 72 y.o. female who was seen today for physical therapy evaluation and treatment for H81.12 (ICD-10-CM) - BPPV (benign paroxysmal positional vertigo), left. Patient demonstrates increased vestibular symptoms with provocative testing which is negatively impacting patient ability to perform ADLs and functional mobility tasks. Patient will benefit from skilled physical therapy services to address these deficits to improve level of function with ADLs, functional mobility tasks, and reduce risk for falls.    OBJECTIVE IMPAIRMENTS: decreased activity tolerance, decreased knowledge of condition, dizziness, and increased muscle spasms.   ACTIVITY LIMITATIONS: bed mobility, reach over head, and caring for others  PARTICIPATION LIMITATIONS: meal prep, cleaning, laundry, driving, shopping, and community activity  REHAB POTENTIAL: Good  CLINICAL DECISION MAKING: Evolving/moderate complexity  EVALUATION COMPLEXITY: Moderate   PLAN:  PT FREQUENCY: 2x/week  PT DURATION: 2 weeks  PLANNED INTERVENTIONS: Therapeutic exercises, Therapeutic activity, Neuromuscular  re-education, Balance training, Gait training, Patient/Family education, Joint manipulation, Joint mobilization, Stair training, Orthotic/Fit training, DME instructions, Aquatic Therapy, Dry Needling, Electrical stimulation, Spinal manipulation, Spinal mobilization, Cryotherapy, Moist heat,  Compression bandaging, scar mobilization, Splintting, Taping, Traction, Ultrasound, Ionotophoresis 4mg /ml Dexamethasone, and Manual therapy   PLAN FOR NEXT SESSION: possible D/C   2:32 PM, 01/06/23 Virgina Organ, PT CLT [phone]

## 2023-01-09 ENCOUNTER — Encounter (HOSPITAL_COMMUNITY): Payer: Medicare PPO

## 2023-01-13 ENCOUNTER — Encounter (HOSPITAL_COMMUNITY): Payer: Medicare PPO | Admitting: Physical Therapy

## 2023-02-10 ENCOUNTER — Encounter (HOSPITAL_COMMUNITY): Payer: Self-pay

## 2023-02-10 NOTE — Therapy (Signed)
[facility] Outpatient Rehabilitation at [facility] [address] [place], [place], [address] Phone: [phone]   Fax:  [phone]  Patient Details  Name: STARLYNN ERMEL MRN: [id number] Date of Birth: [birth year] Referring Provider:  No ref. provider found  PHYSICAL THERAPY DISCHARGE SUMMARY  Visits from Start of Care: 2  Current functional level related to goals / functional outcomes: Goals partially met   Remaining deficits: No dizziness remains   Education / Equipment: HEP   Patient agrees to discharge. Patient goals were partially met. Patient is being discharged due to being pleased with the current functional level.   [facility] Outpatient Rehabilitation at [facility] [address] [place], [place], [address] Phone: [phone]   Fax:  [phone]

## 2023-03-09 DIAGNOSIS — H2513 Age-related nuclear cataract, bilateral: Secondary | ICD-10-CM | POA: Diagnosis not present

## 2023-03-09 DIAGNOSIS — H02834 Dermatochalasis of left upper eyelid: Secondary | ICD-10-CM | POA: Diagnosis not present

## 2023-03-09 DIAGNOSIS — H04123 Dry eye syndrome of bilateral lacrimal glands: Secondary | ICD-10-CM | POA: Diagnosis not present

## 2023-03-09 DIAGNOSIS — H43812 Vitreous degeneration, left eye: Secondary | ICD-10-CM | POA: Diagnosis not present

## 2023-03-09 DIAGNOSIS — H02831 Dermatochalasis of right upper eyelid: Secondary | ICD-10-CM | POA: Diagnosis not present

## 2023-06-12 DIAGNOSIS — Z1231 Encounter for screening mammogram for malignant neoplasm of breast: Secondary | ICD-10-CM | POA: Diagnosis not present

## 2023-06-28 ENCOUNTER — Encounter: Payer: Self-pay | Admitting: Family Medicine

## 2023-08-26 ENCOUNTER — Encounter (INDEPENDENT_AMBULATORY_CARE_PROVIDER_SITE_OTHER): Payer: Self-pay | Admitting: Family Medicine

## 2023-08-26 DIAGNOSIS — T753XXD Motion sickness, subsequent encounter: Secondary | ICD-10-CM | POA: Diagnosis not present

## 2023-08-26 DIAGNOSIS — T753XXA Motion sickness, initial encounter: Secondary | ICD-10-CM

## 2023-08-28 MED ORDER — ONDANSETRON 4 MG PO TBDP: 4.0000 mg | ORAL_TABLET | Freq: Three times a day (TID) | ORAL | 0 refills | Status: AC | PRN

## 2023-08-28 MED ORDER — SCOPOLAMINE 1 MG/3DAYS TD PT72: 1.0000 | MEDICATED_PATCH | TRANSDERMAL | 0 refills | Status: AC

## 2023-08-28 NOTE — Telephone Encounter (Signed)

## 2023-11-29 ENCOUNTER — Encounter: Payer: Medicare PPO | Admitting: Family Medicine

## 2024-02-01 ENCOUNTER — Other Ambulatory Visit: Payer: Self-pay | Admitting: Family Medicine

## 2024-02-01 DIAGNOSIS — I10 Essential (primary) hypertension: Secondary | ICD-10-CM

## 2024-03-12 DIAGNOSIS — H43812 Vitreous degeneration, left eye: Secondary | ICD-10-CM | POA: Diagnosis not present

## 2024-03-12 DIAGNOSIS — H02834 Dermatochalasis of left upper eyelid: Secondary | ICD-10-CM | POA: Diagnosis not present

## 2024-03-12 DIAGNOSIS — H04123 Dry eye syndrome of bilateral lacrimal glands: Secondary | ICD-10-CM | POA: Diagnosis not present

## 2024-03-12 DIAGNOSIS — H02831 Dermatochalasis of right upper eyelid: Secondary | ICD-10-CM | POA: Diagnosis not present

## 2024-03-12 DIAGNOSIS — H2513 Age-related nuclear cataract, bilateral: Secondary | ICD-10-CM | POA: Diagnosis not present

## 2024-04-17 ENCOUNTER — Ambulatory Visit (INDEPENDENT_AMBULATORY_CARE_PROVIDER_SITE_OTHER)

## 2024-04-17 VITALS — BP 123/84 | HR 77 | Ht 65.0 in | Wt 142.0 lb

## 2024-04-17 DIAGNOSIS — Z0000 Encounter for general adult medical examination without abnormal findings: Secondary | ICD-10-CM

## 2024-04-17 DIAGNOSIS — Z13820 Encounter for screening for osteoporosis: Secondary | ICD-10-CM

## 2024-04-17 NOTE — Progress Notes (Signed)
 Subjective:   Candice Beck is a 73 y.o. who presents for a Medicare Wellness preventive visit.  As a reminder, Annual Wellness Visits don't include a physical exam, and some assessments may be limited, especially if this visit is performed virtually. We may recommend an in-person follow-up visit with your provider if needed.  Visit Complete: Virtual I connected with  Candice Beck on 04/17/24 by a video and audio enabled telemedicine application and verified that I am speaking with the correct person using two identifiers.  Patient Location: Home  Provider Location: Office/Clinic  I discussed the limitations of evaluation and management by telemedicine. The patient expressed understanding and agreed to proceed.  Vital Signs: Because this visit was a virtual/telehealth visit, some criteria may be missing or patient reported. Any vitals not documented were not able to be obtained and vitals that have been documented are patient reported.  VideoDeclined- This patient declined Librarian, academic. Therefore the visit was completed with audio only.  Persons Participating in Visit: Patient.  AWV Questionnaire: No: Patient Medicare AWV questionnaire was not completed prior to this visit.  Cardiac Risk Factors include: advanced age (>54men, >45 women);dyslipidemia;hypertension     Objective:    Today's Vitals   04/17/24 1435  BP: 123/84  Pulse: 77  Weight: 142 lb (64.4 kg)  Height: 5' 5 (1.651 m)   Body mass index is 23.63 kg/m.     04/17/2024    2:40 PM 01/03/2023    8:24 AM 12/23/2022    9:26 AM 06/20/2022    9:31 AM 12/08/2021    9:55 AM 12/07/2020    9:55 AM 11/22/2018    3:19 PM  Advanced Directives  Does Patient Have a Medical Advance Directive? Yes Yes Yes Yes Yes No No  Type of Estate Agent of [place];Living will Healthcare Power of [place];Living will Healthcare Power of [place];Living will Healthcare Power of  [place];Living will Healthcare Power of [place];Living will    Does patient want to make changes to medical advance directive?  No - Patient declined       Copy of Healthcare Power of Attorney in Chart? No - copy requested  No - copy requested  No - copy requested    Would patient like information on creating a medical advance directive?      Yes (MAU/Ambulatory/Procedural Areas - Information given) Yes (MAU/Ambulatory/Procedural Areas - Information given)      Data saved with a previous flowsheet row definition    Current Medications (verified) Outpatient Encounter Medications as of 04/17/2024  Medication Sig   cholecalciferol (VITAMIN D ) 1000 UNITS tablet Take 5,000 Units by mouth daily.    Coenzyme Q10 (CO Q 10 PO) Take 1 tablet by mouth daily.   EPINEPHrine  0.3 mg/0.3 mL IJ SOAJ injection Inject 0.3 mg into the muscle as needed (for anaphylactic / allergic reaction. Then call 911).   losartan  (COZAAR ) 50 MG tablet TAKE 1 TABLET BY MOUTH EVERY DAY   ondansetron  (ZOFRAN -ODT) 4 MG disintegrating tablet Take 1 tablet (4 mg total) by mouth every 8 (eight) hours as needed for nausea or vomiting.   Pitavastatin  Calcium  (LIVALO ) 4 MG TABS Take 1 tablet (4 mg total) by mouth daily.   scopolamine  (TRANSDERM-SCOP) 1 MG/3DAYS Place 1 patch (1.5 mg total) onto the skin every 3 (three) days.   valACYclovir  (VALTREX ) 1000 MG tablet Take 1 tablet (1,000 mg total) by mouth daily.   No facility-administered encounter medications on file as of 04/17/2024.  Allergies (verified) Bee venom   History: Past Medical History:  Diagnosis Date   Hyperlipidemia    Hypertension    Vitamin D  deficiency    Past Surgical History:  Procedure Laterality Date   TONSILLECTOMY     Family History  Problem Relation Age of Onset   Cancer Mother        breast    Heart disease Mother        aortic valve replaced x2   Osteoporosis Mother    Cancer Father        throat   Heart disease Brother        heart  attack age 97   Diabetes Brother        due to weight   Hypertension Brother    Hypertension Brother    Cancer Maternal Aunt        breast   Social History   Socioeconomic History   Marital status: Married    Spouse name: Lynwood   Number of children: 2   Years of education: 14   Highest education level: Not on file  Occupational History   Occupation: Retired    Associate Professor: [employer]    Comment: Book radio broadcast assistant  Tobacco Use   Smoking status: Never   Smokeless tobacco: Never  Vaping Use   Vaping status: Never Used  Substance and Sexual Activity   Alcohol use: Never   Drug use: No   Sexual activity: Yes  Other Topics Concern   Not on file  Social History Narrative   Lives home with her husband - daughter lives next door, son in [place], [place]   Enjoys working in her garden and walking her dog   Social Drivers of Corporate Investment Banker Strain: Low Risk  (04/17/2024)   Overall Financial Resource Strain (CARDIA)    Difficulty of Paying Living Expenses: Not hard at all  Food Insecurity: No Food Insecurity (04/17/2024)   Hunger Vital Sign    Worried About Running Out of Food in the Last Year: Never true    Ran Out of Food in the Last Year: Never true  Transportation Needs: No Transportation Needs (04/17/2024)   PRAPARE - Administrator, Civil Service (Medical): No    Lack of Transportation (Non-Medical): No  Physical Activity: Insufficiently Active (04/17/2024)   Exercise Vital Sign    Days of Exercise per Week: 3 days    Minutes of Exercise per Session: 30 min  Stress: No Stress Concern Present (04/17/2024)   [employer] of Occupational Health - Occupational Stress Questionnaire    Feeling of Stress: Not at all  Social Connections: Socially Integrated (04/17/2024)   Social Connection and Isolation Panel    Frequency of Communication with Friends and Family: More than three times a week    Frequency of Social Gatherings  with Friends and Family: More than three times a week    Attends Religious Services: More than 4 times per year    Active Member of [employer] or Organizations: Yes    Attends Engineer, Structural: More than 4 times per year    Marital Status: Married    Tobacco Counseling Counseling given: Yes    Clinical Intake:  Pre-visit preparation completed: Yes  Pain : No/denies pain     BMI - recorded: 23.63 Nutritional Status: BMI of 19-24  Normal Nutritional Risks: None Diabetes: No  No results found for: HGBA1C   How often do you  need to have someone help you when you read instructions, pamphlets, or other written materials from your doctor or pharmacy?: 1 - Never  Interpreter Needed?: No  Information entered by :: alia t/cma   Activities of Daily Living     04/17/2024    2:40 PM  In your present state of health, do you have any difficulty performing the following activities:  Hearing? 0  Vision? 0  Difficulty concentrating or making decisions? 0  Walking or climbing stairs? 0  Dressing or bathing? 0  Doing errands, shopping? 0  Preparing Food and eating ? N  Using the Toilet? N  In the past six months, have you accidently leaked urine? N  Do you have problems with loss of bowel control? N  Managing your Medications? N  Managing your Finances? N  Housekeeping or managing your Housekeeping? N    Patient Care Team: Jolinda Norene HERO, DO as PCP - General (Family Medicine)  I have updated your Care Teams any recent Medical Services you may have received from other providers in the past year.     Assessment:   This is a routine wellness examination for Candice Beck.  Hearing/Vision screen Hearing Screening - Comments:: Pt denies hearing dif Vision Screening - Comments:: Pt wear readers/otherwise no vision dif/pt goes Dr. Octavia in [place], [place]/last ov 1 mos ago   Goals Addressed             This Visit's Progress    Increase physical activity   On track     Walk at least times per week for 30 minutes each session.        Depression Screen     04/17/2024    2:41 PM 12/23/2022    9:21 AM 12/08/2021    9:53 AM 12/07/2021   11:18 AM 02/04/2021   11:02 AM 12/07/2020    9:54 AM 07/29/2020    2:58 PM  PHQ 2/9 Scores  PHQ - 2 Score 0 0 0 0 0 0 0  PHQ- 9 Score       0    Fall Risk     04/17/2024    2:39 PM 12/23/2022    9:18 AM 11/28/2022    8:00 AM 12/08/2021    9:51 AM 12/07/2021   11:18 AM  Fall Risk   Falls in the past year? 0 0 0 0 0  Number falls in past yr: 0 0 0 0   Injury with Fall? 0 0 0 0   Risk for fall due to : No Fall Risks No Fall Risks No Fall Risks Orthopedic patient   Follow up Falls evaluation completed;Education provided Falls prevention discussed Education provided Falls prevention discussed       Data saved with a previous flowsheet row definition    MEDICARE RISK AT HOME:  Medicare Risk at Home Any stairs in or around the home?: Yes If so, are there any without handrails?: Yes Home free of loose throw rugs in walkways, pet beds, electrical cords, etc?: Yes Adequate lighting in your home to reduce risk of falls?: Yes Life alert?: No Use of a cane, walker or w/c?: No Grab bars in the bathroom?: No Shower chair or bench in shower?: Yes Elevated toilet seat or a handicapped toilet?: No  TIMED UP AND GO:  Was the test performed?  no  Cognitive Function: 6CIT completed    01/31/2017    8:59 AM  MMSE - Mini Mental State Exam  Orientation to time 5  Orientation to Place 5   Registration 3   Attention/ Calculation 5   Recall 3   Language- name 2 objects 2   Language- repeat 1  Language- follow 3 step command 3   Language- read & follow direction 1   Write a sentence 1   Copy design 1   Total score 30      Data saved with a previous flowsheet row definition        04/17/2024    2:40 PM 12/23/2022    9:26 AM 11/22/2018    3:25 PM  6CIT Screen  What Year? 0 points 0 points 0 points  What month? 0  points 0 points 0 points  What time? 0 points 0 points 0 points  Count back from 20 0 points 0 points 0 points  Months in reverse 0 points 0 points 0 points  Repeat phrase 0 points 0 points 0 points  Total Score 0 points 0 points 0 points    Immunizations Immunization History  Administered Date(s) Administered   Moderna Sars-Covid-2 Vaccination 08/22/2019, 09/19/2019, 05/20/2020   Tdap 09/29/2011, 11/28/2022   Zoster, Live 09/29/2011    Screening Tests Health Maintenance  Topic Date Due   Pneumococcal Vaccine: 50+ Years (1 of 1 - PCV) Never done   Zoster Vaccines- Shingrix (1 of 2) 11/16/2002   DEXA SCAN  12/09/2023   COVID-19 Vaccine (4 - 2025-26 season) 02/19/2024   Mammogram  06/11/2024   Influenza Vaccine  09/17/2024 (Originally 01/19/2024)   Medicare Annual Wellness (AWV)  04/17/2025   Colonoscopy  12/28/2026   DTaP/Tdap/Td (3 - Td or Tdap) 11/27/2032   Hepatitis C Screening  Completed   Meningococcal B Vaccine  Aged Out    Health Maintenance Items Addressed: DEXA ordered and Mammogram scheduled for 06/14/25 per pt  Additional Screening:  Vision Screening: Recommended annual ophthalmology exams for early detection of glaucoma and other disorders of the eye. Is the patient up to date with their annual eye exam?  Yes  Who is the provider or what is the name of the office in which the patient attends annual eye exams? Dr. Octavia in [place]  Dental Screening: Recommended annual dental exams for proper oral hygiene  Community Resource Referral / Chronic Care Management: CRR required this visit?  No   CCM required this visit?  No   Plan:    I have personally reviewed and noted the following in the patient's chart:   Medical and social history Use of alcohol, tobacco or illicit drugs  Current medications and supplements including opioid prescriptions. Patient is not currently taking opioid prescriptions. Functional ability and status Nutritional  status Physical activity Advanced directives List of other physicians Hospitalizations, surgeries, and ER visits in previous 12 months Vitals Screenings to include cognitive, depression, and falls Referrals and appointments  In addition, I have reviewed and discussed with patient certain preventive protocols, quality metrics, and best practice recommendations. A written personalized care plan for preventive services as well as general preventive health recommendations were provided to patient.   Candice Beck, CMA   04/17/2024   After Visit Summary: (MyChart) Due to this being a telephonic visit, the after visit summary with patients personalized plan was offered to patient via MyChart   Notes: PCP Follow Up Recommendations: pt is aware and due the following: covid, shingles, flu, pnuemonia vaccine--pt declined all except will talk to pcp re shingles at next ov 06/05/24

## 2024-04-17 NOTE — Patient Instructions (Signed)
 Candice Beck,  Thank you for taking the time for your Medicare Wellness Visit. I appreciate your continued commitment to your health goals. Please review the care plan we discussed, and feel free to reach out if I can assist you further.  Medicare recommends these wellness visits once per year to help you and your care team stay ahead of potential health issues. These visits are designed to focus on prevention, allowing your provider to concentrate on managing your acute and chronic conditions during your regular appointments.  Please note that Annual Wellness Visits do not include a physical exam. Some assessments may be limited, especially if the visit was conducted virtually. If needed, we may recommend a separate in-person follow-up with your provider.  Ongoing Care Seeing your primary care provider every 3 to 6 months helps us  monitor your health and provide consistent, personalized care.   Referrals If a referral was made during today's visit and you haven't received any updates within two weeks, please contact the referred provider directly to check on the status.  Recommended Screenings:  Health Maintenance  Topic Date Due   Pneumococcal Vaccine for age over 44 (1 of 1 - PCV) Never done   Zoster (Shingles) Vaccine (1 of 2) 11/16/2002   DEXA scan (bone density measurement)  12/09/2023   Medicare Annual Wellness Visit  12/23/2023   Flu Shot  Never done   COVID-19 Vaccine (4 - 2025-26 season) 02/19/2024   Breast Cancer Screening  06/11/2024   Colon Cancer Screening  12/28/2026   DTaP/Tdap/Td vaccine (3 - Td or Tdap) 11/27/2032   Hepatitis C Screening  Completed   Meningitis B Vaccine  Aged Out       04/17/2024    2:40 PM  Advanced Directives  Does Patient Have a Medical Advance Directive? Yes  Type of Estate Agent of [place];Living will  Copy of Healthcare Power of Attorney in Chart? No - copy requested   Advance Care Planning is important because  it: Ensures you receive medical care that aligns with your values, goals, and preferences. Provides guidance to your family and loved ones, reducing the emotional burden of decision-making during critical moments.  Vision: Annual vision screenings are recommended for early detection of glaucoma, cataracts, and diabetic retinopathy. These exams can also reveal signs of chronic conditions such as diabetes and high blood pressure.  Dental: Annual dental screenings help detect early signs of oral cancer, gum disease, and other conditions linked to overall health, including heart disease and diabetes.  Please see the attached documents for additional preventive care recommendations.

## 2024-05-05 ENCOUNTER — Other Ambulatory Visit: Payer: Self-pay | Admitting: *Deleted

## 2024-05-05 DIAGNOSIS — B001 Herpesviral vesicular dermatitis: Secondary | ICD-10-CM

## 2024-05-06 MED ORDER — VALACYCLOVIR HCL 1 G PO TABS: 1000.0000 mg | ORAL_TABLET | Freq: Every day | ORAL | 0 refills | Status: AC

## 2024-05-06 NOTE — Telephone Encounter (Signed)
 Refill failed. resent

## 2024-05-06 NOTE — Addendum Note (Signed)
 Addended by: Jehieli Brassell D on: 05/06/2024 02:49 PM   Modules accepted: Orders

## 2024-06-05 ENCOUNTER — Ambulatory Visit: Payer: Self-pay | Admitting: Family Medicine

## 2024-06-05 ENCOUNTER — Other Ambulatory Visit: Payer: Self-pay | Admitting: Family Medicine

## 2024-06-05 ENCOUNTER — Encounter: Payer: Self-pay | Admitting: Family Medicine

## 2024-06-05 ENCOUNTER — Other Ambulatory Visit: Payer: Self-pay

## 2024-06-05 VITALS — BP 191/83 | HR 77 | Temp 97.9°F | Ht 64.0 in | Wt 146.2 lb

## 2024-06-05 DIAGNOSIS — Z87892 Personal history of anaphylaxis: Secondary | ICD-10-CM

## 2024-06-05 DIAGNOSIS — I10 Essential (primary) hypertension: Secondary | ICD-10-CM

## 2024-06-05 DIAGNOSIS — Z0000 Encounter for general adult medical examination without abnormal findings: Secondary | ICD-10-CM

## 2024-06-05 DIAGNOSIS — K2100 Gastro-esophageal reflux disease with esophagitis, without bleeding: Secondary | ICD-10-CM

## 2024-06-05 DIAGNOSIS — E7800 Pure hypercholesterolemia, unspecified: Secondary | ICD-10-CM

## 2024-06-05 DIAGNOSIS — G5761 Lesion of plantar nerve, right lower limb: Secondary | ICD-10-CM

## 2024-06-05 DIAGNOSIS — E559 Vitamin D deficiency, unspecified: Secondary | ICD-10-CM | POA: Diagnosis not present

## 2024-06-05 DIAGNOSIS — M8589 Other specified disorders of bone density and structure, multiple sites: Secondary | ICD-10-CM | POA: Diagnosis not present

## 2024-06-05 MED ORDER — PITAVASTATIN CALCIUM 4 MG PO TABS: 1.0000 | ORAL_TABLET | Freq: Every day | ORAL | 3 refills | Status: DC

## 2024-06-05 MED ORDER — LOSARTAN POTASSIUM 50 MG PO TABS: 50.0000 mg | ORAL_TABLET | Freq: Every day | ORAL | 3 refills | Status: DC

## 2024-06-05 MED ORDER — LOSARTAN POTASSIUM 100 MG PO TABS: 100.0000 mg | ORAL_TABLET | Freq: Every day | ORAL | 3 refills | Status: AC

## 2024-06-05 MED ORDER — EPINEPHRINE 0.3 MG/0.3ML IJ SOAJ: 0.3000 mg | INTRAMUSCULAR | 0 refills | Status: AC | PRN

## 2024-06-05 NOTE — Progress Notes (Signed)
 Candice Beck is a 73 y.o. female presents to office today for annual physical exam examination.    She reports that she is doing well.  She is surprised by her blood pressure today because her blood pressures at home have been running 140s over 70s to 80s.  She is compliant with losartan  50 mg daily.  No chest pain, shortness of breath, visual disturbance.  No edema or headache.    Utilizes Valtrex  as needed.  May be getting her eyelid looked out soon because she has been having some interning of the left lower lid  Also reports some discomfort and soft tissue swelling on the plantar surface of the right foot after she dropped something on the midfoot.  Symptoms seem to be worse when she is stepping off.  Occupation: retired, Marital status: married, Substance use: none Health Maintenance Due  Topic Date Due   Bone Density Scan  12/09/2023   Mammogram  06/11/2024    Immunization History  Administered Date(s) Administered   Moderna Sars-Covid-2 Vaccination 08/22/2019, 09/19/2019, 05/20/2020   Tdap 09/29/2011, 11/28/2022   Zoster, Live 09/29/2011   Past Medical History:  Diagnosis Date   Hyperlipidemia    Hypertension    Vitamin D  deficiency    Social History   Socioeconomic History   Marital status: Married    Spouse name: Lynwood   Number of children: 2   Years of education: 14   Highest education level: Not on file  Occupational History   Occupation: Retired    Associate Professor: [employer]    Comment: Book radio broadcast assistant  Tobacco Use   Smoking status: Never   Smokeless tobacco: Never  Vaping Use   Vaping status: Never Used  Substance and Sexual Activity   Alcohol use: Never   Drug use: No   Sexual activity: Yes  Other Topics Concern   Not on file  Social History Narrative   Lives home with her husband - daughter lives next door, son in [place], [place]   Enjoys working in her garden and walking her dog   Social Drivers of Health   Tobacco  Use: Low Risk (06/05/2024)   Patient History    Smoking Tobacco Use: Never    Smokeless Tobacco Use: Never    Passive Exposure: Not on file  Financial Resource Strain: Low Risk (04/17/2024)   Overall Financial Resource Strain (CARDIA)    Difficulty of Paying Living Expenses: Not hard at all  Food Insecurity: No Food Insecurity (04/17/2024)   Epic    Worried About Programme Researcher, Broadcasting/film/video in the Last Year: Never true    Ran Out of Food in the Last Year: Never true  Transportation Needs: No Transportation Needs (04/17/2024)   Epic    Lack of Transportation (Medical): No    Lack of Transportation (Non-Medical): No  Physical Activity: Insufficiently Active (04/17/2024)   Exercise Vital Sign    Days of Exercise per Week: 3 days    Minutes of Exercise per Session: 30 min  Stress: No Stress Concern Present (04/17/2024)   [employer] of Occupational Health - Occupational Stress Questionnaire    Feeling of Stress: Not at all  Social Connections: Socially Integrated (04/17/2024)   Social Connection and Isolation Panel    Frequency of Communication with Friends and Family: More than three times a week    Frequency of Social Gatherings with Friends and Family: More than three times a week    Attends Religious Services: More  than 4 times per year    Active Member of Clubs or Organizations: Yes    Attends Banker Meetings: More than 4 times per year    Marital Status: Married  Catering Manager Violence: Not At Risk (04/17/2024)   Epic    Fear of Current or Ex-Partner: No    Emotionally Abused: No    Physically Abused: No    Sexually Abused: No  Depression (PHQ2-9): Low Risk (04/17/2024)   Depression (PHQ2-9)    PHQ-2 Score: 0  Alcohol Screen: Low Risk (04/17/2024)   Alcohol Screen    Last Alcohol Screening Score (AUDIT): 0  Housing: Unknown (04/17/2024)   Epic    Unable to Pay for Housing in the Last Year: No    Number of Times Moved in the Last Year: Not on file     Homeless in the Last Year: No  Utilities: Not At Risk (04/17/2024)   Epic    Threatened with loss of utilities: No  Health Literacy: Adequate Health Literacy (04/17/2024)   B1300 Health Literacy    Frequency of need for help with medical instructions: Never   Past Surgical History:  Procedure Laterality Date   TONSILLECTOMY     Family History  Problem Relation Age of Onset   Cancer Mother        breast    Heart disease Mother        aortic valve replaced x2   Osteoporosis Mother    Cancer Father        throat   Heart disease Brother        heart attack age 47   Diabetes Brother        due to weight   Hypertension Brother    Hypertension Brother    Cancer Maternal Aunt        breast   Current Medications[1]  Allergies[2]   ROS: Review of Systems Pertinent items noted in HPI and remainder of comprehensive ROS otherwise negative.    Physical exam BP (!) 191/83   Pulse 77   Temp 97.9 F (36.6 C)   Ht 5' 4 (1.626 m)   Wt 146 lb 4 oz (66.3 kg)   SpO2 99%   BMI 25.10 kg/m  General appearance: alert, cooperative, appears stated age, and no distress Head: Normocephalic, without obvious abnormality, atraumatic Eyes: negative findings: lids and lashes normal, conjunctivae and sclerae normal, corneas clear, and pupils equal, round, reactive to light and accomodation Ears: normal TM's and external ear canals both ears Nose: Nares normal. Septum midline. Mucosa normal. No drainage or sinus tenderness. Throat: lips, mucosa, and tongue normal; teeth and gums normal Neck: no adenopathy, no carotid bruit, supple, symmetrical, trachea midline, and thyroid  not enlarged, symmetric, no tenderness/mass/nodules Back: symmetric, no curvature. ROM normal. No CVA tenderness. Lungs: clear to auscultation bilaterally Heart: regular rate and rhythm, S1, S2 normal, no murmur, click, rub or gallop Abdomen: soft, non-tender; bowel sounds normal; no masses,  no organomegaly Extremities:  extremities normal, atraumatic, no cyanosis or edema Pulses: 2+ and symmetric Skin: Skin color, texture, turgor normal. No rashes or lesions Lymph nodes: No supraclavicular or anterior cervical lymph node enlargement Neurologic: Alert and oriented X 3, normal strength and tone. Normal symmetric reflexes. Normal coordination and gait      06/05/2024    8:30 AM 04/17/2024    2:41 PM 12/23/2022    9:21 AM  Depression screen PHQ 2/9  Decreased Interest 0 0 0  Down, Depressed, Hopeless 0  0 0  PHQ - 2 Score 0 0 0  Altered sleeping 0    Tired, decreased energy 0    Change in appetite 0    Feeling bad or failure about yourself  0    Trouble concentrating 0    Moving slowly or fidgety/restless 0    Suicidal thoughts 0    PHQ-9 Score 0    Difficult doing work/chores Not difficult at all        06/05/2024    8:31 AM 12/07/2021   11:18 AM 02/04/2021   11:02 AM  GAD 7 : Generalized Anxiety Score  Nervous, Anxious, on Edge 0 0 0  Control/stop worrying 0 0 0  Worry too much - different things 0 0 0  Trouble relaxing 0 0 0  Restless 0 0 0  Easily annoyed or irritable 0 0 0  Afraid - awful might happen 0 0 0  Total GAD 7 Score 0 0 0  Anxiety Difficulty Not difficult at all Not difficult at all Not difficult at all    No results found for this or any previous visit (from the past 2160 hours).   Assessment/ Plan: Candice Beck here for annual physical exam.   Annual physical exam  Primary hypertension - Plan: CMP14+EGFR, losartan  (COZAAR ) 100 MG tablet, Basic metabolic panel with GFR, DISCONTINUED: losartan  (COZAAR ) 50 MG tablet  Pure hypercholesterolemia - Plan: CMP14+EGFR, TSH, Lipid Panel, DISCONTINUED: Pitavastatin  Calcium  (LIVALO ) 4 MG TABS  Osteopenia of multiple sites - Plan: CMP14+EGFR, VITAMIN D  25 Hydroxy (Vit-D Deficiency, Fractures)  Vitamin D  deficiency - Plan: CMP14+EGFR, VITAMIN D  25 Hydroxy (Vit-D Deficiency, Fractures)  Gastroesophageal reflux disease with  esophagitis without hemorrhage - Plan: CMP14+EGFR, CBC with Differential  History of anaphylaxis - Plan: EPINEPHrine  0.3 mg/0.3 mL IJ SOAJ injection  Morton's neuroma of right foot   Blood pressure not at goal.  Advance to 100 mg daily.  Check renal function, liver enzymes.  I would like her to be rechecked in the next 2 weeks for blood pressure and also repeat BMP given increase in ARB  Her Livalo  is not covered by insurance so we will try and switch her to the Pitavastatin  4 mg alternative that does appear to be on the formulary with insurance.  She has failed atorvastatin  and seemed to do well on Livalo  but it looks like insurance will no longer work with that medication.  Check vitamin D  given history of osteopenia and vitamin D  deficiency  Check CBC given history of GERD and anaphylaxis.  Epinephrine  renewed for as needed use  Concern on right plantar foot is consistent with a Morton's neuroma.  Discussed utilization of metatarsal pad  Counseled on healthy lifestyle choices, including diet (rich in fruits, vegetables and lean meats and low in salt and simple carbohydrates) and exercise (at least 30 minutes of moderate physical activity daily).  Patient to follow up 1 m for BP check with me  Eladio Dentremont M. Haruye Lainez, DO        [1]  Current Outpatient Medications:    cholecalciferol (VITAMIN D ) 1000 UNITS tablet, Take 5,000 Units by mouth daily. , Disp: , Rfl:    Coenzyme Q10 (CO Q 10 PO), Take 1 tablet by mouth daily., Disp: , Rfl:    valACYclovir  (VALTREX ) 1000 MG tablet, Take 1 tablet (1,000 mg total) by mouth daily., Disp: 100 tablet, Rfl: 0   EPINEPHrine  0.3 mg/0.3 mL IJ SOAJ injection, Inject 0.3 mg into the muscle as needed (for anaphylactic /  allergic reaction. Then call 911)., Disp: 1 each, Rfl: 0   losartan  (COZAAR ) 50 MG tablet, TAKE 1 TABLET BY MOUTH EVERY DAY, Disp: 100 tablet, Rfl: 1   ondansetron  (ZOFRAN -ODT) 4 MG disintegrating tablet, Take 1 tablet (4 mg total) by  mouth every 8 (eight) hours as needed for nausea or vomiting. (Patient not taking: Reported on 06/05/2024), Disp: 20 tablet, Rfl: 0   Pitavastatin  Calcium  (LIVALO ) 4 MG TABS, Take 1 tablet (4 mg total) by mouth daily., Disp: 100 tablet, Rfl: 3   scopolamine  (TRANSDERM-SCOP) 1 MG/3DAYS, Place 1 patch (1.5 mg total) onto the skin every 3 (three) days. (Patient not taking: Reported on 06/05/2024), Disp: 10 patch, Rfl: 0 [2]  Allergies Allergen Reactions   Bee Venom Swelling

## 2024-06-05 NOTE — Telephone Encounter (Signed)
°  simvastatin (ZOCOR) 10 MG tablet        Changed from: Pitavastatin  Magnesium (ZYPITAMAG ) 4 MG TABS    Pharmacy comment: Alternative Requested:THE PRESCRIBED MEDICATION IS NOT COVERED BY INSURANCE. PLEASE CONSIDER CHANGING TO ONE OF THE SUGGESTED COVERED ALTERNATIVES.   All Pharmacy Suggested Alternatives:  simvastatin (ZOCOR) 10 MG tablet pravastatin (PRAVACHOL) 40 MG tablet lovastatin (MEVACOR) 40 MG tablet atorvastatin  (LIPITOR) 40 MG tablet rosuvastatin  (CRESTOR ) 5 MG tablet

## 2024-06-05 NOTE — Telephone Encounter (Signed)
 I already replaced this this am with the recommended Zypitmag.  There is a STEP edit that the pharmacy needs to do to put it through the ins.  She has tried AND failed 2 statins , Lipitor and Livalo  is not covered. Ccing Mliss as well for assist

## 2024-06-05 NOTE — Telephone Encounter (Signed)
 ezetimibe (ZETIA) 10 MG tablet        Changed from: Pitavastatin  Calcium  (LIVALO ) 4 MG TABS    Pharmacy comment: Alternative Requested:NOT COVERED BY INSURANCE.   All Pharmacy Suggested Alternatives:  ezetimibe (ZETIA) 10 MG tablet Pitavastatin  Magnesium (ZYPITAMAG ) 2 MG TABS

## 2024-06-06 LAB — CMP14+EGFR
ALT: 12 IU/L (ref 0–32)
AST: 15 IU/L (ref 0–40)
Albumin: 4.2 g/dL (ref 3.8–4.8)
Alkaline Phosphatase: 104 IU/L (ref 49–135)
BUN/Creatinine Ratio: 18 (ref 12–28)
BUN: 14 mg/dL (ref 8–27)
Bilirubin Total: 0.5 mg/dL (ref 0.0–1.2)
CO2: 26 mmol/L (ref 20–29)
Calcium: 9.6 mg/dL (ref 8.7–10.3)
Chloride: 106 mmol/L (ref 96–106)
Creatinine, Ser: 0.78 mg/dL (ref 0.57–1.00)
Globulin, Total: 2.6 g/dL (ref 1.5–4.5)
Glucose: 93 mg/dL (ref 70–99)
Potassium: 4 mmol/L (ref 3.5–5.2)
Sodium: 144 mmol/L (ref 134–144)
Total Protein: 6.8 g/dL (ref 6.0–8.5)
eGFR: 81 mL/min/1.73 (ref >59)

## 2024-06-06 LAB — CBC WITH DIFFERENTIAL/PLATELET
Basophils Absolute: 0 x10E3/uL (ref 0.0–0.2)
Basos: 1 %
EOS (ABSOLUTE): 0.3 x10E3/uL (ref 0.0–0.4)
Eos: 3 %
Hematocrit: 41.3 % (ref 34.0–46.6)
Hemoglobin: 13.5 g/dL (ref 11.1–15.9)
Immature Grans (Abs): 0 x10E3/uL (ref 0.0–0.1)
Immature Granulocytes: 0 %
Lymphocytes Absolute: 2.3 x10E3/uL (ref 0.7–3.1)
Lymphs: 30 %
MCH: 29.4 pg (ref 26.6–33.0)
MCHC: 32.7 g/dL (ref 31.5–35.7)
MCV: 90 fL (ref 79–97)
Monocytes Absolute: 0.4 x10E3/uL (ref 0.1–0.9)
Monocytes: 6 %
Neutrophils Absolute: 4.5 x10E3/uL (ref 1.4–7.0)
Neutrophils: 60 %
Platelets: 332 x10E3/uL (ref 150–450)
RBC: 4.59 x10E6/uL (ref 3.77–5.28)
RDW: 13.2 % (ref 11.7–15.4)
WBC: 7.5 x10E3/uL (ref 3.4–10.8)

## 2024-06-06 LAB — LIPID PANEL
Chol/HDL Ratio: 4.1 ratio (ref 0.0–4.4)
Cholesterol, Total: 201 mg/dL — ABNORMAL HIGH (ref 100–199)
HDL: 49 mg/dL (ref >39)
LDL Chol Calc (NIH): 120 mg/dL — ABNORMAL HIGH (ref 0–99)
Triglycerides: 181 mg/dL — ABNORMAL HIGH (ref 0–149)
VLDL Cholesterol Cal: 32 mg/dL (ref 5–40)

## 2024-06-06 LAB — VITAMIN D 25 HYDROXY (VIT D DEFICIENCY, FRACTURES): Vit D, 25-Hydroxy: 40.9 ng/mL (ref 30.0–100.0)

## 2024-06-06 LAB — TSH: TSH: 2.35 u[IU]/mL (ref 0.450–4.500)

## 2024-06-07 ENCOUNTER — Ambulatory Visit: Payer: Self-pay | Admitting: Family Medicine

## 2024-06-10 ENCOUNTER — Telehealth: Payer: Self-pay

## 2024-06-10 ENCOUNTER — Telehealth: Payer: Self-pay | Admitting: Pharmacist

## 2024-06-10 ENCOUNTER — Other Ambulatory Visit (HOSPITAL_COMMUNITY): Payer: Self-pay

## 2024-06-10 ENCOUNTER — Ambulatory Visit: Payer: Self-pay | Admitting: Family Medicine

## 2024-06-10 ENCOUNTER — Other Ambulatory Visit

## 2024-06-10 DIAGNOSIS — Z13820 Encounter for screening for osteoporosis: Secondary | ICD-10-CM | POA: Diagnosis not present

## 2024-06-10 DIAGNOSIS — M8589 Other specified disorders of bone density and structure, multiple sites: Secondary | ICD-10-CM | POA: Diagnosis not present

## 2024-06-10 DIAGNOSIS — G720 Drug-induced myopathy: Secondary | ICD-10-CM

## 2024-06-10 DIAGNOSIS — Z0000 Encounter for general adult medical examination without abnormal findings: Secondary | ICD-10-CM | POA: Diagnosis not present

## 2024-06-10 NOTE — Telephone Encounter (Signed)
 Pharmacy Patient Advocate Encounter   Received notification from Physician's Office that prior authorization for Zypitamag  4 mg tablets is required/requested.   Insurance verification completed.   The patient is insured through [place].   Per test claim:  Try SIMVASTATIN,PRAVASTATIN,LOVASTATIN,A is preferred by the insurance.  If suggested medication is appropriate, Please send in a new RX and discontinue this one. If not, please advise as to why it's not appropriate so that we may request a Prior Authorization. Please note, some preferred medications may still require a PA.  If the suggested medications have not been trialed and there are no contraindications to their use, the PA will not be submitted, as it will not be approved.

## 2024-06-10 NOTE — Telephone Encounter (Signed)
 Can you see if PA is required?  Local pharmacy is not filling it and requires STEP therapy She needs Zypitamag  generic (different from Livalo ) She has tried and failed 2 statins (atorvastatin , rosuvastatin )

## 2024-06-14 LAB — HM MAMMOGRAPHY

## 2024-06-21 ENCOUNTER — Other Ambulatory Visit

## 2024-06-21 ENCOUNTER — Ambulatory Visit (INDEPENDENT_AMBULATORY_CARE_PROVIDER_SITE_OTHER): Admitting: *Deleted

## 2024-06-21 VITALS — BP 175/77

## 2024-06-21 DIAGNOSIS — I10 Essential (primary) hypertension: Secondary | ICD-10-CM

## 2024-06-22 LAB — BASIC METABOLIC PANEL WITH GFR
BUN/Creatinine Ratio: 17 (ref 12–28)
BUN: 14 mg/dL (ref 8–27)
CO2: 26 mmol/L (ref 20–29)
Calcium: 10.1 mg/dL (ref 8.7–10.3)
Chloride: 103 mmol/L (ref 96–106)
Creatinine, Ser: 0.83 mg/dL (ref 0.57–1.00)
Glucose: 88 mg/dL (ref 70–99)
Potassium: 4 mmol/L (ref 3.5–5.2)
Sodium: 143 mmol/L (ref 134–144)
eGFR: 75 mL/min/1.73

## 2024-06-24 ENCOUNTER — Encounter: Payer: Self-pay | Admitting: Family Medicine

## 2024-06-24 DIAGNOSIS — H8112 Benign paroxysmal vertigo, left ear: Secondary | ICD-10-CM

## 2024-06-25 ENCOUNTER — Other Ambulatory Visit: Payer: Self-pay | Admitting: Family Medicine

## 2024-06-25 ENCOUNTER — Telehealth: Payer: Self-pay

## 2024-06-25 DIAGNOSIS — I10 Essential (primary) hypertension: Secondary | ICD-10-CM

## 2024-06-25 MED ORDER — AMLODIPINE BESYLATE 5 MG PO TABS: 5.0000 mg | ORAL_TABLET | Freq: Every day | ORAL | 3 refills | Status: AC

## 2024-06-25 NOTE — Telephone Encounter (Signed)
 Patient would like to know if she needs to stop taking the losartan  or take it with the amlodipine ? Patient also stated that she would like to take the Cholesterol medication that she has for a while and have levels rechecked.

## 2024-06-25 NOTE — Telephone Encounter (Signed)
 BOTH. I messaged her on mychart as well.

## 2024-06-25 NOTE — Telephone Encounter (Signed)
"  Left message to callback with any questions   "

## 2024-07-05 DIAGNOSIS — G720 Drug-induced myopathy: Secondary | ICD-10-CM | POA: Insufficient documentation

## 2024-07-05 NOTE — Telephone Encounter (Signed)
 Insurance wants us  to try/fail simva/prava/lova OR write why she can't take these. Also, pitavastatin  is costing >$100 month on medicare plans :( Just FYI! Would she potentially be a candidate for alternative 2-3 times weekly dosing?

## 2024-07-05 NOTE — Therapy (Signed)
 " OUTPATIENT PHYSICAL THERAPY VESTIBULAR EVALUATION     Patient Name: Candice Beck MRN: [id number] DOB:[birth year], 74 y.o., female Today's Date: 07/08/2024  END OF SESSION:  PT End of Session - 07/08/24 0744     Visit Number 1    Number of Visits 6    Date for Recertification  08/09/24    Authorization Type [employer]    Authorization Time Period see auth    PT Start Time 0740    PT Stop Time 0815    PT Time Calculation (min) 35 min    Activity Tolerance Patient tolerated treatment well    Behavior During Therapy [facility] for tasks assessed/performed          Past Medical History:  Diagnosis Date   Hyperlipidemia    Hypertension    Vitamin D  deficiency    Past Surgical History:  Procedure Laterality Date   TONSILLECTOMY     Patient Active Problem List   Diagnosis Date Noted   Statin myopathy 07/05/2024   Pharyngitis 03/19/2021   Gastroesophageal reflux disease with esophagitis without hemorrhage 03/18/2020   Morton's neuroma of right foot 03/18/2020   Metatarsalgia of both feet 03/18/2020   History of anaphylaxis 03/19/2019   Plantar fasciitis of left foot 10/30/2018   Osteopenia 03/07/2014   Hypertension 04/01/2013   Hyperlipidemia 04/01/2013   Vitamin D  deficiency 04/01/2013    PCP: Jolinda Norene HERO, DO REFERRING PROVIDER: Jolinda Norene HERO, DO  REFERRING DIAG: [phone] (ICD-10-CM) - BPPV (benign paroxysmal positional vertigo), left  THERAPY DIAG:  Dizziness and giddiness  ONSET DATE: 06/24/24  Rationale for Evaluation and Treatment: Rehabilitation  SUBJECTIVE:   SUBJECTIVE STATEMENT: Here previously back in 2024 for BPPV; last Monday had put something in cabinet and then looked up to dust and went into orbit; with dizziness. Pt accompanied by: significant other  PERTINENT HISTORY: history of BPPV in the past  PAIN:  Are you having pain? Yes: NPRS scale: 4/10 Pain location: left eye Pain description: an ache Aggravating factors: with  BPPV Relieving factors: be still, tylenol   PRECAUTIONS: None     WEIGHT BEARING RESTRICTIONS: No  FALLS: Has patient fallen in last 6 months? No  LIVING ENVIRONMENT: Lives with: lives with their spouse Lives in: House/apartment Stairs: Yes: Internal: flight steps; on left going up and External: 3 steps; on right going up Has following equipment at home: None  PLOF: Independent  PATIENT GOALS: to not get dizzy  OBJECTIVE:  Note: Objective measures were completed at Evaluation unless otherwise noted.  DIAGNOSTIC FINDINGS:   COGNITION: Overall cognitive status: Within functional limits for tasks assessed   SENSATION: WFL  EDEMA:  None noted   POSTURE:  rounded shoulders and forward head  Cervical ROM:    Active AROM (deg) eval  Flexion   Extension guarded  Right lateral flexion   Left lateral flexion   Right rotation   Left rotation guarded  (Blank rows = not tested)  STRENGTH:   LOWER EXTREMITY MMT:   MMT Right eval Left eval  Hip flexion    Hip abduction    Hip adduction    Hip internal rotation    Hip external rotation    Knee flexion    Knee extension    Ankle dorsiflexion    Ankle plantarflexion    Ankle inversion    Ankle eversion    (Blank rows = not tested)  BED MOBILITY:  Sit to supine CGA Supine to sit CGA Rolling to Right CGA  TRANSFERS: Assistive device utilized: None  Sit to stand: SBA Stand to sit: SBA Chair to chair: SBA Floor: not tested  GAIT: Gait pattern: WFL Distance walked: 50 ft in clinic Assistive device utilized: None Level of assistance: SBA and CGA Comments: CGA on way out after canalith repositioning manuever  FUNCTIONAL TESTS:  5 times sit to stand: next visit  PATIENT SURVEYS:  DHI: THE DIZZINESS HANDICAP INVENTORY (DHI)  P1. Does looking up increase your problem? 4 = Yes  E2. Because of your problem, do you feel frustrated? 4 = Yes  F3. Because of your problem, do you restrict your travel for  business or recreation?  0 = No  P4. Does walking down the aisle of a supermarket increase your problems?  2 = Sometimes  F5. Because of your problem, do you have difficulty getting into or out of bed?  4 = Yes  F6. Does your problem significantly restrict your participation in social activities, such as going out to dinner, going to the movies, dancing, or going to parties? 0 = No  F7. Because of your problem, do you have difficulty reading?  0 = No  P8. Does performing more ambitious activities such as sports, dancing, household chores (sweeping or putting dishes away) increase your problems?  4 = Yes  E9. Because of your problem, are you afraid to leave your home without having without having someone accompany you?  0 = No  E10. Because of your problem have you been embarrassed in front of others?  0 = No  P11. Do quick movements of your head increase your problem?  4 = Yes  F12. Because of your problem, do you avoid heights?  4 = Yes  P13. Does turning over in bed increase your problem?  4 = Yes  F14. Because of your problem, is it difficult for you to do strenuous homework or yard work? 0 = No  E15. Because of your problem, are you afraid people may think you are intoxicated? 4 = Yes  F16. Because of your problem, is it difficult for you to go for a walk by yourself?  2 = Sometimes  P17. Does walking down a sidewalk increase your problem?  0 = No  E18.Because of your problem, is it difficult for you to concentrate 2 = Sometimes  F19. Because of your problem, is it difficult for you to walk around your house in the dark? 0 = No  E20. Because of your problem, are you afraid to stay home alone?  0 = No  E21. Because of your problem, do you feel handicapped? 0 = No  E22. Has the problem placed stress on your relationships with members of your family or friends? 0 = No  E23. Because of your problem, are you depressed?  0 = No  F24. Does your problem interfere with your job or household  responsibilities?  0 = No  P25. Does bending over increase your problem?  4 = Yes  TOTAL 42    DHI Scoring Instructions  The patient is asked to answer each question as it pertains to dizziness or unsteadiness problems, specifically  considering their condition during the last month. Questions are designed to incorporate functional (F), physical  (P), and emotional (E) impacts on disability.   Scores greater than 10 points should be referred to balance specialists for further evaluation.   16-34 Points (mild handicap)  36-52 Points (moderate handicap)  54+ Points (severe handicap)  Minimally Detectable Change: 17  points [facility] [place], 1990)  [place], JUDITHANN GLADE and [place], C. W. (1990). The development of the Dizziness Handicap Inventory. Archives of Otolaryngology - Head and Neck Surgery 116(4): [id number].   VESTIBULAR ASSESSMENT:  GENERAL OBSERVATION: has reading glassess   SYMPTOM BEHAVIOR:  Subjective history: see above  Non-Vestibular symptoms: headaches and nausea/vomiting  Type of dizziness: Spinning/Vertigo  Frequency: daily  Duration: short  Aggravating factors: Induced by position change: lying supine and rolling to the left and Induced by motion: looking up at the ceiling and turning head quickly  Relieving factors: closing eyes and rest  Progression of symptoms: unchanged  OCULOMOTOR EXAM:  Ocular Alignment: normal  Ocular ROM: No Limitations  Spontaneous Nystagmus: absent  Gaze-Induced Nystagmus: absent  Smooth Pursuits: not tested  Saccades: not tested  Convergence/Divergence:  cm   Cover-cross-cover test:     VESTIBULAR - OCULAR REFLEX:   Slow VOR:   VOR Cancellation:   Head-Impulse Test:   Dynamic Visual Acuity:    POSITIONAL TESTING: Left Dix-Hallpike: upbeating, left nystagmus  MOTION SENSITIVITY:  Motion Sensitivity Quotient Intensity: 0 = none, 1 = Lightheaded, 2 = Mild, 3 = Moderate, 4 = Severe, 5 = Vomiting  Intensity  1. Sitting to  supine   2. Supine to L side   3. Supine to R side   4. Supine to sitting   5. L Hallpike-Dix   6. Up from L    7. R Hallpike-Dix   8. Up from R    9. Sitting, head tipped to L knee   10. Head up from L knee   11. Sitting, head tipped to R knee   12. Head up from R knee   13. Sitting head turns x5   14.Sitting head nods x5   15. In stance, 180 turn to L    16. In stance, 180 turn to R     OTHOSTATICS: not done                                                                                                                               TREATMENT DATE: 07/08/2024 physical therapy evaluation and HEP instruction   Canalith Repositioning:  Epley Left: Number of Reps: 1, Response to Treatment: symptoms worsened/converted, and Comment: nauseated after treatment Gaze Adaptation:   Habituation:   Other:   PATIENT EDUCATION: Education details: Patient educated on exam findings, POC, scope of PT, HEP, and information on BPPV. Person educated: Patient Education method: Explanation, Demonstration, and Handouts Education comprehension: verbalized understanding, returned demonstration, verbal cues required, and tactile cues required   HOME EXERCISE PROGRAM: Access Code: [email or web address] URL: [email or web address] Date: 07/08/2024 Prepared by: AP - Rehab  Exercises - Left Self Epley Manuever  - 1 x daily - 7 x weekly - 1 sets - 10 reps  Patient Education - BPPV - What Is BPPV? - BPPV - BPPV - After BPPV Repositioning GOALS: Goals reviewed with patient? No  SHORT TERM  GOALS: Target date: 07/22/2024  patient will be independent with initial HEP and compliant with HEP 3-4 times a week   Baseline: Goal status: INITIAL  2.  Patient will report 50% improvement overall  Baseline:  Goal status: INITIAL   LONG TERM GOALS: Target date: 08/05/2024  Patient will be independent in self management strategies to improve quality of life and functional  outcomes.  Baseline:  Goal status: INITIAL  2.  Patient will report 80% improvement overall  Baseline:  Goal status: INITIAL  3.  Patient will improve DHI score to 10 or less to demonstrate decreased dizziness with functional activity Baseline:  Goal status: INITIAL  4.  Patient will be able to lie down in bed and get up without any dizziness for 2 weeks Baseline:  Goal status: INITIAL ASSESSMENT:  CLINICAL IMPRESSION: Patient is a 74 y.o. female who was seen today for physical therapy evaluation and treatment for H81.12 (ICD-10-CM) - BPPV (benign paroxysmal positional vertigo), left.  Patient demonstrates increased vestibular symptoms with provocative testing which is negatively impacting patient ability to perform ADLs and functional mobility tasks. Patient will benefit from skilled physical therapy services to address these deficits to improve level of function with ADLs, functional mobility tasks, and reduce risk for falls.    OBJECTIVE IMPAIRMENTS: Abnormal gait, decreased activity tolerance, dizziness, and pain.   ACTIVITY LIMITATIONS: carrying, lifting, bending, reach over head, and locomotion level  PARTICIPATION LIMITATIONS: meal prep, cleaning, laundry, driving, shopping, and community activity  REHAB POTENTIAL: Good  CLINICAL DECISION MAKING: Evolving/moderate complexity  EVALUATION COMPLEXITY: Moderate   PLAN:  PT FREQUENCY: 2x/week  PT DURATION: 3 weeks  PLANNED INTERVENTIONS: 97164- PT Re-evaluation, 97110-Therapeutic exercises, 97530- Therapeutic activity, 97112- Neuromuscular re-education, 97535- Self Care, 02859- Manual therapy, [id number]- Gait training, [phone]- Orthotic Fit/training, [phone]- Canalith repositioning, [id number]- Aquatic Therapy, [id number]- Splinting, [id number]- Wound care (first 20 sq cm), 97598- Wound care (each additional 20 sq cm)Patient/Family education, Balance training, Stair training, Taping, Dry Needling, Joint mobilization, Joint manipulation, Spinal  manipulation, Spinal mobilization, Scar mobilization, and DME instructions.   PLAN FOR NEXT SESSION: Review goals; retest left side posterior canal; test right if left is clear  8:17 AM, 07/08/24 Irish Breisch Small Roxanne Orner MPT [place] [facility] [place] [phone] [phone]  [employer] Auth Request Treatment Start Date: 07/08/2024  Referring diagnosis code (ICD 10)? H81.12 (ICD-10-CM) - BPPV (benign paroxysmal positional vertigo), left Treatment diagnosis codes (ICD 10)? (if different than referring diagnosis) R42 What was this (referring dx) caused by? []  Surgery []  Fall [x]  Ongoing issue []  Arthritis []  Other: ____________  Laterality: []  Rt [x]  Lt []  Both  Deficits: [x]  Pain [x]  Stiffness []  Weakness []  Edema [x]  Balance Deficits []  Coordination [x]  Gait Disturbance []  ROM [x]  Other: dizziness   Functional Tool Score: DHI 42/100  CPT codes: See Planned Interventions listed in the Plan section of the Evaluation.    "

## 2024-07-05 NOTE — Telephone Encounter (Signed)
 I am fine with any of those as long as she is

## 2024-07-08 ENCOUNTER — Ambulatory Visit (HOSPITAL_COMMUNITY): Attending: Family Medicine

## 2024-07-08 ENCOUNTER — Telehealth: Payer: Self-pay | Admitting: Pharmacist

## 2024-07-08 ENCOUNTER — Other Ambulatory Visit: Payer: Self-pay

## 2024-07-08 DIAGNOSIS — H8112 Benign paroxysmal vertigo, left ear: Secondary | ICD-10-CM | POA: Insufficient documentation

## 2024-07-08 DIAGNOSIS — R42 Dizziness and giddiness: Secondary | ICD-10-CM | POA: Insufficient documentation

## 2024-07-08 DIAGNOSIS — E7800 Pure hypercholesterolemia, unspecified: Secondary | ICD-10-CM

## 2024-07-08 NOTE — Telephone Encounter (Signed)
 [id number] to pharmacy for hyperlipidemia management Insurance will not cover pitavastatin  Will seek alternative dosing or non statin alternative  Mliss Tarry Griffin, PharmD, BCACP, CPP Clinical Pharmacist, [facility]

## 2024-07-09 ENCOUNTER — Telehealth: Payer: Self-pay

## 2024-07-09 NOTE — Progress Notes (Signed)
 Care Guide Pharmacy Note  07/09/2024 Name: [name] MRN: [id number] DOB: July 04, 1950  Referred By: Jolinda Norene HERO, DO Reason for referral: Complex Care Management (Outreach to schedule with pharm d )   Candice Beck is a 74 y.o. year old female who is a primary care patient of Jolinda Norene HERO, DO.  [name] was referred to the pharmacist for assistance related to: HLD  Successful contact was made with the patient to discuss pharmacy services including being ready for the pharmacist to call at least 5 minutes before the scheduled appointment time and to have medication bottles and any blood pressure readings ready for review. The patient agreed to meet with the pharmacist via telephone visit on (date/time).08/15/2024  Jeoffrey Buffalo , RMA     [place]  [facility], [facility] Guide  Direct Dial: [phone]  Website: [email or web address]

## 2024-07-12 ENCOUNTER — Ambulatory Visit (HOSPITAL_COMMUNITY)

## 2024-07-12 DIAGNOSIS — R42 Dizziness and giddiness: Secondary | ICD-10-CM | POA: Diagnosis not present

## 2024-07-12 NOTE — Therapy (Signed)
 " OUTPATIENT PHYSICAL THERAPY VESTIBULAR DISCHARGE PHYSICAL THERAPY DISCHARGE SUMMARY  Visits from Start of Care: 2  Current functional level related to goals / functional outcomes: See below   Remaining deficits: See below   Education / Equipment: HEP/self Epley if needed/ BPPV education  Patient agrees to discharge. Patient goals were met. Patient is being discharged due to meeting the stated rehab goals.      Patient Name: Candice Beck MRN: [id number] DOB:07/24/1950, 74 y.o., female Today's Date: 07/12/2024  END OF SESSION:  PT End of Session - 07/12/24 1500     Visit Number 2    Number of Visits 6    Date for Recertification  08/09/24    Authorization Type [employer]    Authorization Time Period 8 visits from 07/08/24 to 08/07/24    Authorization - Visit Number 1    Authorization - Number of Visits 8    Progress Note Due on Visit 8    PT Start Time 1501    PT Stop Time 1516    PT Time Calculation (min) 15 min    Activity Tolerance Patient tolerated treatment well    Behavior During Therapy [facility] for tasks assessed/performed          Past Medical History:  Diagnosis Date   Hyperlipidemia    Hypertension    Vitamin D  deficiency    Past Surgical History:  Procedure Laterality Date   TONSILLECTOMY     Patient Active Problem List   Diagnosis Date Noted   Statin myopathy 07/05/2024   Pharyngitis 03/19/2021   Gastroesophageal reflux disease with esophagitis without hemorrhage 03/18/2020   Morton's neuroma of right foot 03/18/2020   Metatarsalgia of both feet 03/18/2020   History of anaphylaxis 03/19/2019   Plantar fasciitis of left foot 10/30/2018   Osteopenia 03/07/2014   Hypertension 04/01/2013   Hyperlipidemia 04/01/2013   Vitamin D  deficiency 04/01/2013    PCP: Jolinda Norene HERO, DO REFERRING PROVIDER: Jolinda Norene HERO, DO  REFERRING DIAG: [phone] (ICD-10-CM) - BPPV (benign paroxysmal positional vertigo), left  THERAPY DIAG:   Dizziness and giddiness  ONSET DATE: 06/24/24  Rationale for Evaluation and Treatment: Rehabilitation  SUBJECTIVE:   SUBJECTIVE STATEMENT: No dizziness since last visit; returns to make sure it's gone  Eval: Here previously back in 2024 for BPPV; last Monday had put something in cabinet and then looked up to dust and went into orbit; with dizziness. Pt accompanied by: significant other  PERTINENT HISTORY: history of BPPV in the past  PAIN:  Are you having pain? Yes: NPRS scale: 4/10 Pain location: left eye Pain description: an ache Aggravating factors: with BPPV Relieving factors: be still, tylenol   PRECAUTIONS: None     WEIGHT BEARING RESTRICTIONS: No  FALLS: Has patient fallen in last 6 months? No  LIVING ENVIRONMENT: Lives with: lives with their spouse Lives in: House/apartment Stairs: Yes: Internal: flight steps; on left going up and External: 3 steps; on right going up Has following equipment at home: None  PLOF: Independent  PATIENT GOALS: to not get dizzy  OBJECTIVE:  Note: Objective measures were completed at Evaluation unless otherwise noted.  DIAGNOSTIC FINDINGS:   COGNITION: Overall cognitive status: Within functional limits for tasks assessed   SENSATION: WFL  EDEMA:  None noted   POSTURE:  rounded shoulders and forward head  Cervical ROM:    Active AROM (deg) eval  Flexion   Extension guarded  Right lateral flexion   Left lateral flexion   Right rotation  Left rotation guarded  (Blank rows = not tested)  STRENGTH:   LOWER EXTREMITY MMT:   MMT Right eval Left eval  Hip flexion    Hip abduction    Hip adduction    Hip internal rotation    Hip external rotation    Knee flexion    Knee extension    Ankle dorsiflexion    Ankle plantarflexion    Ankle inversion    Ankle eversion    (Blank rows = not tested)  BED MOBILITY:  Sit to supine CGA Supine to sit CGA Rolling to Right CGA  TRANSFERS: Assistive device  utilized: None  Sit to stand: SBA Stand to sit: SBA Chair to chair: SBA Floor: not tested  GAIT: Gait pattern: WFL Distance walked: 50 ft in clinic Assistive device utilized: None Level of assistance: SBA and CGA Comments: CGA on way out after canalith repositioning manuever  FUNCTIONAL TESTS:  5 times sit to stand: next visit  PATIENT SURVEYS:  DHI: THE DIZZINESS HANDICAP INVENTORY (DHI)  P1. Does looking up increase your problem? 4 = Yes  E2. Because of your problem, do you feel frustrated? 4 = Yes  F3. Because of your problem, do you restrict your travel for business or recreation?  0 = No  P4. Does walking down the aisle of a supermarket increase your problems?  2 = Sometimes  F5. Because of your problem, do you have difficulty getting into or out of bed?  4 = Yes  F6. Does your problem significantly restrict your participation in social activities, such as going out to dinner, going to the movies, dancing, or going to parties? 0 = No  F7. Because of your problem, do you have difficulty reading?  0 = No  P8. Does performing more ambitious activities such as sports, dancing, household chores (sweeping or putting dishes away) increase your problems?  4 = Yes  E9. Because of your problem, are you afraid to leave your home without having without having someone accompany you?  0 = No  E10. Because of your problem have you been embarrassed in front of others?  0 = No  P11. Do quick movements of your head increase your problem?  4 = Yes  F12. Because of your problem, do you avoid heights?  4 = Yes  P13. Does turning over in bed increase your problem?  4 = Yes  F14. Because of your problem, is it difficult for you to do strenuous homework or yard work? 0 = No  E15. Because of your problem, are you afraid people may think you are intoxicated? 4 = Yes  F16. Because of your problem, is it difficult for you to go for a walk by yourself?  2 = Sometimes  P17. Does walking down a sidewalk  increase your problem?  0 = No  E18.Because of your problem, is it difficult for you to concentrate 2 = Sometimes  F19. Because of your problem, is it difficult for you to walk around your house in the dark? 0 = No  E20. Because of your problem, are you afraid to stay home alone?  0 = No  E21. Because of your problem, do you feel handicapped? 0 = No  E22. Has the problem placed stress on your relationships with members of your family or friends? 0 = No  E23. Because of your problem, are you depressed?  0 = No  F24. Does your problem interfere with your job or household responsibilities?  0 = No  P25. Does bending over increase your problem?  4 = Yes  TOTAL 42    DHI Scoring Instructions  The patient is asked to answer each question as it pertains to dizziness or unsteadiness problems, specifically  considering their condition during the last month. Questions are designed to incorporate functional (F), physical  (P), and emotional (E) impacts on disability.   Scores greater than 10 points should be referred to balance specialists for further evaluation.   16-34 Points (mild handicap)  36-52 Points (moderate handicap)  54+ Points (severe handicap)  Minimally Detectable Change: 17 points ([address] [place], 1990)  Tollette, G. SHAUNNA. and Bernice, C. W. (1990). The development of the Dizziness Handicap Inventory. Archives of Otolaryngology - Head and Neck Surgery 116(4): [id number].   VESTIBULAR ASSESSMENT:  GENERAL OBSERVATION: has reading glassess   SYMPTOM BEHAVIOR:  Subjective history: see above  Non-Vestibular symptoms: headaches and nausea/vomiting  Type of dizziness: Spinning/Vertigo  Frequency: daily  Duration: short  Aggravating factors: Induced by position change: lying supine and rolling to the left and Induced by motion: looking up at the ceiling and turning head quickly  Relieving factors: closing eyes and rest  Progression of symptoms: unchanged  OCULOMOTOR EXAM:  Ocular  Alignment: normal  Ocular ROM: No Limitations  Spontaneous Nystagmus: absent  Gaze-Induced Nystagmus: absent  Smooth Pursuits: not tested  Saccades: not tested  Convergence/Divergence:  cm   Cover-cross-cover test:     VESTIBULAR - OCULAR REFLEX:   Slow VOR:   VOR Cancellation:   Head-Impulse Test:   Dynamic Visual Acuity:    POSITIONAL TESTING: Left Dix-Hallpike: upbeating, left nystagmus  MOTION SENSITIVITY:  Motion Sensitivity Quotient Intensity: 0 = none, 1 = Lightheaded, 2 = Mild, 3 = Moderate, 4 = Severe, 5 = Vomiting  Intensity  1. Sitting to supine   2. Supine to L side   3. Supine to R side   4. Supine to sitting   5. L Hallpike-Dix   6. Up from L    7. R Hallpike-Dix   8. Up from R    9. Sitting, head tipped to L knee   10. Head up from L knee   11. Sitting, head tipped to R knee   12. Head up from R knee   13. Sitting head turns x5   14.Sitting head nods x5   15. In stance, 180 turn to L    16. In stance, 180 turn to R     OTHOSTATICS: not done                                                                                                                               TREATMENT DATE:  07/13/23 Dix hallpike left negative; right negative Education on self Epley; seeking a new referral if symptoms recur and cannot correct on her own  07/08/2024 physical therapy evaluation and HEP instruction   Canalith Repositioning:  Epley Left:  Number of Reps: 1, Response to Treatment: symptoms worsened/converted, and Comment: nauseated after treatment Gaze Adaptation:   Habituation:   Other:   PATIENT EDUCATION: Education details: Patient educated on exam findings, POC, scope of PT, HEP, and information on BPPV. Person educated: Patient Education method: Explanation, Demonstration, and Handouts Education comprehension: verbalized understanding, returned demonstration, verbal cues required, and tactile cues required   HOME EXERCISE PROGRAM: Access Code:  [email or web address] URL: [email or web address] Date: 07/08/2024 Prepared by: AP - Rehab  Exercises - Left Self Epley Manuever  - 1 x daily - 7 x weekly - 1 sets - 10 reps  Patient Education - BPPV - What Is BPPV? - BPPV - BPPV - After BPPV Repositioning GOALS: Goals reviewed with patient? No  SHORT TERM GOALS: Target date: 07/22/2024  patient will be independent with initial HEP and compliant with HEP 3-4 times a week   Baseline: Goal status: MET  2.  Patient will report 50% improvement overall  Baseline:  Goal status: MET   LONG TERM GOALS: Target date: 08/05/2024  Patient will be independent in self management strategies to improve quality of life and functional outcomes.  Baseline:  Goal status: MET  2.  Patient will report 80% improvement overall  Baseline:  Goal status: MET  3.  Patient will improve DHI score to 10 or less to demonstrate decreased dizziness with functional activity Baseline:  Goal status: MET  4.  Patient will be able to lie down in bed and get up without any dizziness for 2 weeks Baseline:  Goal status: MET ASSESSMENT:  CLINICAL IMPRESSION: Patient reports no dizziness since last visit.  Retesting of right and left side dix hall pike negative each side.  Educated patient on what to do in case of recurrence; self -Epley versus seeking another referral if she cannot self correct.  Patient is agreeable to discharge.   Eval: Patient is a 74 y.o. female who was seen today for physical therapy evaluation and treatment for H81.12 (ICD-10-CM) - BPPV (benign paroxysmal positional vertigo), left.  Patient demonstrates increased vestibular symptoms with provocative testing which is negatively impacting patient ability to perform ADLs and functional mobility tasks. Patient will benefit from skilled physical therapy services to address these deficits to improve level of function with ADLs, functional mobility tasks, and reduce risk for falls.     OBJECTIVE IMPAIRMENTS: Abnormal gait, decreased activity tolerance, dizziness, and pain.   ACTIVITY LIMITATIONS: carrying, lifting, bending, reach over head, and locomotion level  PARTICIPATION LIMITATIONS: meal prep, cleaning, laundry, driving, shopping, and community activity  REHAB POTENTIAL: Good  CLINICAL DECISION MAKING: Evolving/moderate complexity  EVALUATION COMPLEXITY: Moderate   PLAN:  PT FREQUENCY: 2x/week  PT DURATION: 3 weeks  PLANNED INTERVENTIONS: 97164- PT Re-evaluation, 97110-Therapeutic exercises, 97530- Therapeutic activity, 97112- Neuromuscular re-education, 97535- Self Care, [id number]- Manual therapy, [id number]- Gait training, [phone]- Orthotic Fit/training, [phone]- Canalith repositioning, [id number]- Aquatic Therapy, [id number]- Splinting, [id number]- Wound care (first 20 sq cm), 97598- Wound care (each additional 20 sq cm)Patient/Family education, Balance training, Stair training, Taping, Dry Needling, Joint mobilization, Joint manipulation, Spinal manipulation, Spinal mobilization, Scar mobilization, and DME instructions.   PLAN FOR NEXT SESSION: discharge  3:18 PM, 07/12/24 Alexandar Weisenberger Small Bradee Common MPT [facility] [place] [phone] Ph:[phone]  "

## 2024-07-17 ENCOUNTER — Ambulatory Visit (HOSPITAL_COMMUNITY)

## 2024-07-19 ENCOUNTER — Ambulatory Visit (HOSPITAL_COMMUNITY)

## 2024-07-22 ENCOUNTER — Ambulatory Visit: Admitting: Family Medicine

## 2024-07-23 ENCOUNTER — Ambulatory Visit (HOSPITAL_COMMUNITY)

## 2024-07-26 ENCOUNTER — Ambulatory Visit (HOSPITAL_COMMUNITY)

## 2024-08-08 ENCOUNTER — Ambulatory Visit: Admitting: Family Medicine

## 2024-08-15 ENCOUNTER — Other Ambulatory Visit

## 2025-06-10 ENCOUNTER — Encounter: Admitting: Family Medicine
